# Patient Record
Sex: Female | Born: 1960 | Race: White | Hispanic: No | Marital: Married | Smoking: Never smoker
Health system: Southern US, Community
[De-identification: ages and names within clinical notes are randomized; demographics above are authoritative.]

## PROBLEM LIST (undated history)

## (undated) DIAGNOSIS — R011 Cardiac murmur, unspecified: Secondary | ICD-10-CM

## (undated) DIAGNOSIS — D219 Benign neoplasm of connective and other soft tissue, unspecified: Secondary | ICD-10-CM

## (undated) DIAGNOSIS — R87619 Unspecified abnormal cytological findings in specimens from cervix uteri: Secondary | ICD-10-CM

## (undated) DIAGNOSIS — IMO0002 Reserved for concepts with insufficient information to code with codable children: Secondary | ICD-10-CM

## (undated) DIAGNOSIS — R32 Unspecified urinary incontinence: Secondary | ICD-10-CM

## (undated) DIAGNOSIS — A64 Unspecified sexually transmitted disease: Secondary | ICD-10-CM

## (undated) DIAGNOSIS — C801 Malignant (primary) neoplasm, unspecified: Secondary | ICD-10-CM

## (undated) DIAGNOSIS — M797 Fibromyalgia: Secondary | ICD-10-CM

## (undated) HISTORY — DX: Fibromyalgia: M79.7

## (undated) HISTORY — DX: Unspecified urinary incontinence: R32

## (undated) HISTORY — DX: Benign neoplasm of connective and other soft tissue, unspecified: D21.9

## (undated) HISTORY — DX: Reserved for concepts with insufficient information to code with codable children: IMO0002

## (undated) HISTORY — DX: Unspecified sexually transmitted disease: A64

## (undated) HISTORY — PX: BLADDER SURGERY: SHX569

## (undated) HISTORY — PX: BREAST SURGERY: SHX581

## (undated) HISTORY — PX: BREAST EXCISIONAL BIOPSY: SUR124

## (undated) HISTORY — DX: Cardiac murmur, unspecified: R01.1

## (undated) HISTORY — DX: Malignant (primary) neoplasm, unspecified: C80.1

## (undated) HISTORY — DX: Unspecified abnormal cytological findings in specimens from cervix uteri: R87.619

## (undated) HISTORY — PX: ABDOMINAL HYSTERECTOMY: SHX81

---

## 2003-06-27 ENCOUNTER — Other Ambulatory Visit: Admission: RE | Admit: 2003-06-27 | Discharge: 2003-06-27 | Payer: Self-pay | Admitting: Internal Medicine

## 2003-06-29 ENCOUNTER — Ambulatory Visit (HOSPITAL_BASED_OUTPATIENT_CLINIC_OR_DEPARTMENT_OTHER): Admission: RE | Admit: 2003-06-29 | Discharge: 2003-06-29 | Payer: Self-pay | Admitting: Gynecology

## 2003-06-29 ENCOUNTER — Encounter (INDEPENDENT_AMBULATORY_CARE_PROVIDER_SITE_OTHER): Payer: Self-pay | Admitting: Specialist

## 2003-06-29 ENCOUNTER — Ambulatory Visit (HOSPITAL_COMMUNITY): Admission: RE | Admit: 2003-06-29 | Discharge: 2003-06-29 | Payer: Self-pay | Admitting: Gynecology

## 2003-07-27 ENCOUNTER — Encounter (INDEPENDENT_AMBULATORY_CARE_PROVIDER_SITE_OTHER): Payer: Self-pay

## 2003-07-27 ENCOUNTER — Inpatient Hospital Stay (HOSPITAL_COMMUNITY): Admission: RE | Admit: 2003-07-27 | Discharge: 2003-07-30 | Payer: Self-pay | Admitting: Gynecology

## 2003-08-09 ENCOUNTER — Ambulatory Visit (HOSPITAL_COMMUNITY): Admission: RE | Admit: 2003-08-09 | Discharge: 2003-08-09 | Payer: Self-pay | Admitting: Gynecology

## 2005-04-22 ENCOUNTER — Encounter: Admission: RE | Admit: 2005-04-22 | Discharge: 2005-07-21 | Payer: Self-pay | Admitting: *Deleted

## 2005-04-22 ENCOUNTER — Ambulatory Visit (HOSPITAL_COMMUNITY): Admission: RE | Admit: 2005-04-22 | Discharge: 2005-04-22 | Payer: Self-pay | Admitting: *Deleted

## 2005-08-19 ENCOUNTER — Encounter: Admission: RE | Admit: 2005-08-19 | Discharge: 2005-10-12 | Payer: Self-pay | Admitting: *Deleted

## 2005-09-02 ENCOUNTER — Observation Stay (HOSPITAL_COMMUNITY): Admission: RE | Admit: 2005-09-02 | Discharge: 2005-09-03 | Payer: Self-pay | Admitting: *Deleted

## 2005-09-02 ENCOUNTER — Encounter (INDEPENDENT_AMBULATORY_CARE_PROVIDER_SITE_OTHER): Payer: Self-pay | Admitting: Specialist

## 2005-10-13 HISTORY — PX: LAPAROSCOPIC GASTRIC BANDING: SHX1100

## 2005-10-28 ENCOUNTER — Encounter: Admission: RE | Admit: 2005-10-28 | Discharge: 2006-01-26 | Payer: Self-pay | Admitting: *Deleted

## 2006-02-03 ENCOUNTER — Encounter: Admission: RE | Admit: 2006-02-03 | Discharge: 2006-02-03 | Payer: Self-pay | Admitting: *Deleted

## 2006-10-13 HISTORY — PX: LAPAROSCOPIC CHOLECYSTECTOMY: SUR755

## 2006-12-21 ENCOUNTER — Ambulatory Visit (HOSPITAL_COMMUNITY): Admission: RE | Admit: 2006-12-21 | Discharge: 2006-12-21 | Payer: Self-pay | Admitting: Surgery

## 2006-12-28 ENCOUNTER — Inpatient Hospital Stay (HOSPITAL_COMMUNITY): Admission: EM | Admit: 2006-12-28 | Discharge: 2006-12-30 | Payer: Self-pay | Admitting: Emergency Medicine

## 2006-12-28 ENCOUNTER — Encounter (INDEPENDENT_AMBULATORY_CARE_PROVIDER_SITE_OTHER): Payer: Self-pay | Admitting: *Deleted

## 2006-12-28 ENCOUNTER — Encounter: Admission: RE | Admit: 2006-12-28 | Discharge: 2006-12-28 | Payer: Self-pay | Admitting: Internal Medicine

## 2008-01-27 DIAGNOSIS — N3941 Urge incontinence: Secondary | ICD-10-CM | POA: Insufficient documentation

## 2008-09-01 ENCOUNTER — Encounter: Admission: RE | Admit: 2008-09-01 | Discharge: 2008-09-01 | Payer: Self-pay | Admitting: Family Medicine

## 2009-06-05 ENCOUNTER — Emergency Department (HOSPITAL_BASED_OUTPATIENT_CLINIC_OR_DEPARTMENT_OTHER): Admission: EM | Admit: 2009-06-05 | Discharge: 2009-06-05 | Payer: Self-pay | Admitting: Emergency Medicine

## 2009-09-28 ENCOUNTER — Encounter: Admission: RE | Admit: 2009-09-28 | Discharge: 2009-09-28 | Payer: Self-pay | Admitting: Family Medicine

## 2010-10-13 HISTORY — PX: COLPOSCOPY: SHX161

## 2010-10-13 HISTORY — PX: OTHER SURGICAL HISTORY: SHX169

## 2011-02-28 NOTE — Op Note (Signed)
Amy Ritter, Amy Ritter               ACCOUNT NO.:  [id number]   MEDICAL RECORD NO.:  [id number]          PATIENT TYPE:  INP   LOCATION:  0098                         FACILITY:  [facility]   PHYSICIAN:  Ardeth Sportsman, MD     DATE OF BIRTH:  [birth year]   DATE OF PROCEDURE:  DATE OF DISCHARGE:                               OPERATIVE REPORT   PRIMARY CARE PHYSICIAN:  Eileen Stanford, P.A., urgent care center.   SURGEON:  Ardeth Sportsman, M.D.   ASSISTANT:  Sandria Bales. Ezzard Standing, M.D.   PREOPERATIVE DIAGNOSIS:  Acute cholecystitis.   POSTOPERATIVE DIAGNOSIS:  Acute cholecystitis with probable gangrene.   PROCEDURE PERFORMED:  Laparoscopic lysis of adhesions x60 minutes and  laparoscopic cholecystectomy with intraoperative cholangiogram.   ANESTHESIA:  1. General anesthesia.  2. Local anesthetic in a field block around all port sites.   ASSESSMENT:  Gallbladder drains, a 15 Jamaica Blake drain through a right  upper quadrant incision with the curving in the subhepatic space with  the tip in Morrison's pouch.   ESTIMATED BLOOD LOSS:  300 ml.   COMPLICATIONS:  None apparent.   INDICATIONS:  Amy Ritter is a 50 year old female, status post  adjustable gastric banding laparoscopically by Dr. Colin Benton back in  November 2006, who has had about a 10-day history of worsening abdominal  pain with exam and ultrasound today concerning for acute cholecystitis.   The anatomy and physiology of hepatobiliary and pancreatic function was  discussed.  The pathophysiology of cholecystolithiasis and acute  cholecystitis with other risks were discussed.  Options were discussed,  and recommendation was made for laparoscopic cholecystectomy with  intraoperative cholangiogram.   The risks such as stroke, heart attack, deep venous thrombosis,  pulmonary embolus, and death are discussed.  Risks such as bleeding,  need for transfusion, wound infection, abscess, injury to other organs,  bile duct leak or injury  resulting in the need for internal/external  drainage and/or operative reconstruction was discussed.  Other risks  were discussed.  Questions answered.  She agreed to proceed.   OPERATIVE FINDINGS:  She had a very thickened gallbladder with the  greater omentum concreted onto the gallbladder.  There were some  possible pockets of gangrene but no definite perforation.  She had  empyema of the gallbladder.  The cholangiogram appeared to be normal.   DESCRIPTION OF PROCEDURE:  Informed consent was confirmed.  She was  already on IV Unasyn.  She urinated prior to going to the operating room  table.  She had sequential compression devices active during the entire  case.  She underwent general anesthesia without difficulty.  She was  positioned supine with both arms tucked.  Her abdomen was prepped and  draped in a sterile fashion.   Entry was gained into the abdomen through an infraumbilical vertical  incision.  The towel clamp was used to hold the umbilical stalk for  fascial counter-traction.  The Varies needle was passed easily into the  abdomen.  The needle aspirated and flushed easily.  Capnopreperitoneum  to 15 mmHg provided good abdominal insufflation.  A 5 mm port  was  placed.  Camera inspection revealed no intra-abdominal injury.  Findings  as noted above.   Under direct visualization, 5 mm ports were placed in the right  subcostal and right flank region, and a 10 mm port was placed through  the falciform ligament on the subxiphoid region.   Careful dissection was made to help free the greater omentum, transverse  colon as well as duodenum off the gallbladder.  Initially, gallbladder  could not be grasped, so it was aspirated with white bile and some  purulence in it.  It allowed the gallbladder to be better grasped.  Also, we were able to free off  the attachments on the peritoneal  surface of the gallbladder.  This is where the majority of the bleeding  was lost since there  was a lot of dense omental adhesions, and they were  very raw and frayed and tried to minimize the use of cautery initially  to avoid any accidental injury.  Ultimately, the infundibulum could be  grasped, and the peritoneum could be freed off the anteriomedial, and  posteriolateral aspects of the gallbladder as it touched near the liver.  Circumferential dissection was done, and ultimately, it was able to  identify pulsatile structure going into the anteromedial aspect of the  gallbladder, consistent with the anterior branch of the cystic artery.  One clip on the gallbladder side and two clips slightly proximal, this  was transected.  Further dissection revealed a posterior branch of the  cystic artery that unfortunately shred a little bit and there was also a  little bit of blood with this.  I was able to get a couple of clips on  to get excellent hemostasis.  Care and dissection was made to make sure  that this was indeed the posterior branch of the cystic artery and was.  One clip on the gallbladder side was made, and the posterior branch of  the cystic artery was transected.  This left one remaining structure  going from the infundibulum of the gallbladder down into the portal  hepatis, consistent with the cystic duct .  Two clips were made on the  infundibulum, and a partial cystic ductotomy was performed.   A 5 French cholangiogram catheter was placed through a subxiphoid stab  incision, flushed, and passed into the partial ductotomy close to the  infundibulum.  I was able to clamp around the catheter with a clip, and  it flushed well.  A cholangiogram was run using continuous fluoroscopy  with good flow into a side branch, consistent with cystic duct  cannulization.  Contrast flowed easily up into the common hepatic duct  and right and left intrahepatic chains.  No evidence of leak of bile.  Contrast flowed well into the distal common bile duct into the duodenum as well.  This  consisted with intraoperative cholangiogram.  The  cholangiocatheter was removed.   Four clips were made on the cystic duct just slightly proximal to the  partial cystectotomy, and transection was completed.  The gallbladder  was freed from its remaining attachments on the liver bed using careful  control of dissection.  Aggressive cautery was used for good hemostasis  on the liver bed.   Careful inspection of the cystic and arterial, anterior and posterior  branch stumps as well as cystic duct stump were done, and they looked  nice and clear with no leak of bile or blood.  Copious irrigation of  probably about 8 liters of saline was done  total to help break up old  clot on the omentum and make sure there was no evidence of any active  bleeding.  At the end, there was nice, clear return.  There was some  aeration down the pelvis and left upper quadrant as well, and this was  carefully aspirated.  A 15 [place] Blake drain was passed into the  abdomen under direct visualization and brought out the right subcostal  port site.  The tip was directed in the Morrison's pouch posterior, and  the middle part was brought up underneath the liver bed to help catch  any excess fluid and help decrease the risk of abscess formation and  keep an eye and make sure there was no bleeders.  The omentum was pushed  up there also to help with hemostasis.  I did not feel Surgicel was  needed, as there was no active bleeding at this time.  The drain site  was secured using 2-0 nylon stitch.   The gallbladder was placed in an EndoCatch bag.  Attempt was made to  remove it out the subxiphoid port, but the bag ripped, so the incision  had to be opened up, and the gallbladder removed out.  The fascial  defect was large enough that I used a laparoscopic suture passer.  I  used 0 Vicryl x4 interrupted stitches to help reapproximate the fascia.  The upper three abdominal ports were removed, and careful inspection   revealed no evidence of any bleeding.  Inspection of the liver again  revealed no evidence of bleeding.  The capnopreperitoneum was evacuated,  and umbilical port was removed.  Fascial stitches were tied down the  subxiphoid.  About 500 ml of saline was given into the wound to nice,  clear return.  The wound was partially approximated using a 4-0 Monocryl  stitch, and the gauze was packed into the wound with a wick to help  prevent deep infection.  The rest of the incisions were closed using 4-0  Monocryl.  A sterile dressing was placed around all of the drains and  open areas and incisions.  The patient was extubated and taken to the  recovery room in stable condition.   I explained the operative findings to the patient's family, and  postoperative instructions were explained.  Questions were answered, and  they expressed understanding and appreciation.      Ardeth Sportsman, MD  Electronically Signed    SCG/MEDQ  D:  12/28/2006  T:  12/29/2006  Job:  [id number]   cc:   Alfonse Ras, MD  [address]  [place]  [place] [address]   Eileen Stanford, P.A.

## 2011-02-28 NOTE — Discharge Summary (Signed)
Amy Ritter, Amy Ritter               ACCOUNT NO.:  [id number]   MEDICAL RECORD NO.:  [id number]          PATIENT TYPE:  INP   LOCATION:  1317                         FACILITY:  [facility]   PHYSICIAN:  Ardeth Sportsman, MD     DATE OF BIRTH:  Dec 05, 1960   DATE OF ADMISSION:  12/28/2006  DATE OF DISCHARGE:                               DISCHARGE SUMMARY   DISCHARGE SUMMARY   PRIMARY CARE PHYSICIAN:  Dr. Eileen Stanford, [facility]   OTHER SURGEONS:  Bariatric Surgeon is Dr. Baruch Merl, Surgeon Dr.  Karie Soda.   DIAGNOSES:  Acute cholecystitis with empyema of the gallbladder.  Morbid obesity status post adjustable gastric banding.  Dysfunctional uterine bleeding status post hysterectomy.  Stress urinary incontinence status post Birch bladder tacking.  Benign right breast biopsy in the past.   PROCEDURE PERFORMED:  Laparoscopic cholecystectomy with intraoperative  cholangiogram on December 28, 2006.   SUMMARY OF HOSPITAL COURSE:  Amy Ritter is a 50 year old female who  underwent adjustable gastric banding back in November 2006 for morbid  obesity and has been followed by Dr. Colin Benton and had been making good  improvements.  Unfortunately she developed abdominal pain and had  evidence of acute cholecystitis.  She was admitted, placed on IV  antibiotics, and underwent emergent cholecystectomy.  She was placed on  IV antibiotics and hydrated with IV medications and IV fluid.   By the time of discharge she was tolerating oral intake well and had  good pain control on all pain medications.  She was walking the hallways  well.  Her leukocytosis resolved on IV cefoxitin and she was switched  over to oral Augmentin.  She has numerous allergies but seemed to have  good pain control with Percocet and Tylenol.  SHE HAS POOR TOLERANCE TO  NONSTEROIDALS.   Since she had improved, and it was felt reasonable for her to be  discharged home with the following instructions:   1. She is to return  to see me in about 7-10 days.  She has a drain and      needs to have daily outputs recorded for this.  We most likely will      remove the drain at that time if the output remains low.  Drain did      not look bilious.  2. She should take Augmentin 875 mg p.o. b.i.d. for the next 10 days.      It is a rather large pill with her band, but given her allergies to      other medications, I thought this was the most reasonable      medication for her and she probably can crush it if okay with the      pharmacy.  3. Percocet and Tylenol for pain control (Percocet 5/325 1-2 p.o. q.4      hours p.r.n. pain #50).  She can also do heating pads or ice packs      as needed or warm showers as needed for pain.  4. She should follow up with Dr. Colin Benton probably in the next 2 or  3      weeks after recovery from the cholecystitis for monthly followup on      her adjustable gastric banding.  Dr. Colin Benton is out of town, but I      did at least mention the case to one of the bariatric surgeons, who      thought it was a reasonable plan as well.  5. She should call if she has any fevers, chills, sweats, nausea,      vomiting, terrible diarrhea, constipation or other      concerns.  6. She is not on any other home medications.  She does not need any      additional home medications at this time, just the Percocet and      Augmentin as already mentioned above.      Ardeth Sportsman, MD  Electronically Signed     SCG/MEDQ  D:  12/30/2006  T:  12/30/2006  Job:  [id number]

## 2011-02-28 NOTE — H&P (Signed)
[name], HENDRICKSEN NO.:  [id number]   MEDICAL RECORD NO.:  [id number]          PATIENT TYPE:  INP   LOCATION:  1610                         [facility]ACILITY:  [facility]   PHYSICIAN:  Ardeth Sportsman, MD     DATE OF BIRTH:  [birth year]   DATE OF ADMISSION:  12/28/2006  DATE OF DISCHARGE:                              HISTORY & PHYSICAL   REQUESTING PHYSICIAN:  Eileen Stanford, P.A., at urgent care center, and  emergency physician, Gavin Pound. Oletta Lamas, M.D.   REASON FOR VISIT:  Probable acute cholecystitis.   HISTORY OF PRESENT ILLNESS:  Ms. Amy Ritter is a 50 year old female, obese,  who underwent laparoscopic adjustable gastric banding by Dr. Baruch Merl back in November, 2006.  She has had consistent weight loss and  has done well; however, about 10 days ago, she noted that she started  getting intermittent episodes of abdominal pain.  It tended to be in the  right upper quadrant after eating.  It occasionally radiated to her back  and shoulder.  She had nausea and threw up.  She went to the urgent care  center, and Eileen Stanford, physician's assistant, saw her last week and  recommended an ultrasound for evaluation of possible gallbladder  etiology.  She had that done today.  Ultrasound was concerning for acute  cholecystitis.  The patient notes that she is having worsening pain, and  now instead of being intermittent, it has been rather constant pain.  Basically because of concerns over this condition, she was able to come  to the emergency room for evaluation and surgical consultation.  The  patient denies any sick contacts or any travel history.  She normally  has a bowel movement about every 2-3 days after the band, given her  decreased appetite.  No history of any ulcer disease, heartburn, or  reflux.  She did have a vagotomy with her adjustable gastric banding.  No real fevers, chills, or sweats.  She never really had anything like  this before.  She did not take any  Actigall or bile absorption salts  after the gastric banding.   PAST MEDICAL HISTORY:  1. Morbid obesity, status post adjustable gastric banding.  2. Dysfunctional uterine bleeding in the past.  3. Stress urinary incontinence.   She does have a history of fibromyalgia in the distant past as well as  insomnia.  That seems to have resolved.   PAST SURGICAL HISTORY:  She has had C-section for twins.  She did have a  hysterectomy as well as a retropubic urethropexy for stress urinary  incontinence for her birth procedure, emulsion with culdoplasty by Dr.  Lily Peer on July 27, 2003.  She did have laparoscopic adjustable  gastric banding with Drs. Lorenza Evangelist on September 02, 2005.  Next,  she has had a right breast biopsy in the past that was benign.   MEDICATIONS:  None currently.   ALLERGIES:  She has problems with ASPIRIN and IBUPROFEN as well as  MORPHINE, CELEXA , and SULFA.  She apparently tolerates DILAUDID well.   FAMILY HISTORY:  Noncontributory for any  major GI disorders or any major  cardiac or pulmonary events.   SOCIAL HISTORY:  No tobacco, alcohol, or illicit drug use.   REVIEW OF SYSTEMS:  As noted in HPI, otherwise constitutional,  ophthalmologic, ENT, cardiac, respiratory, hepatic, renal, and endocrine  are otherwise negative.  Musculoskeletal, neurological, psychiatric,  dermatologic, heme, lymph are otherwise negative.  GI is noted as per  HPI, otherwise no hematochezia or melena, otherwise completely negative.  GYN:  As noted per HPI, otherwise negative.   PHYSICAL EXAMINATION:  VITAL SIGNS:  Her temperature is 98.2, pulse 55-  70, 6/10 pain, respirations 18-20, 97% on room air, blood pressure  116/76.  GENERAL:  She is a well-developed, well-nourished, obese female, not  toxic, but not completely comfortable.  HEENT:  She is normocephalic with no facial asymmetry.  Mucous membranes  are dry.  Nasopharynx and oropharynx is clear.  Pupils are equal,  round  and reactive to light.  Extraocular movements are intact.  Sclerae are  anicteric or injected.  NECK:  Supple without any masses.  Trachea is midline.  CHEST:  Clear to auscultation bilaterally with wheezes, rales or  rhonchi.  No pain on rib or sternal compression.  HEART:  Regular rate and rhythm.  No murmurs, clicks, or rubs.  ABDOMEN:  Her incisions are all well healed.  She has a subcutaneous  mass in her right mid abdomen, consistent with her poor access for her  adjustable gastric band.  There is no fluctulence or cellulitis.  There  are no incisional hernias.  In the right upper quadrant, she does have  tenderness to palpation with a little bit of a mass effect and a  positive Murphy's sign.  She does not have peritonitis elsewhere.  She  has no umbilical hernia.  GENITOURINARY:  Normal external female genitalia.  RECTAL:  Deferred per patient's request.  EXTREMITIES:  No significant clubbing, cyanosis or edema.  MUSCULOSKELETAL:  Full range of motion of her shoulders as well as  wrists, hips, knees, ankles.  LYMPH:  No head/neck axillary or groin lymphadenopathy.  PSYCHIATRIC:  Pleasant, interactive.  No evidence of dementia, delirium,  psychosis, or paranoia.   LABORATORY VALUES:  Her white count is 5.5 with a hemoglobin of 13.6.  Her electrolytes are normal.  Liver function tests and lipase are in the  normal range, INR of 1.  Urinalysis is negative.   Ultrasound shows some gallbladder wall thickening around 5.5 mm (normal  less than 3 mm).  She has possible area of pericholecystic fluid.  She  has gallstones but no definite wedge stones.  She has a sonographic  Murphy's sign.  She has no ductal dilatation.  The common bile duct is  around 4 mm size.  This is all consistent with acute cholecystitis.   ASSESSMENT/PLAN:  A 50 year old female with rapid weight loss over the past year and a half, secondary to adjustable gastric banding with  symptomatic  cholecystolithiasis and examination and sonographic findings  concerning for cholecystitis.  Anatomy and embryology of hepatobiliary  function and pancreatic function was explained.  Pathophysiology of  cholecystolithiasis with its risk of acute cholecystotic gallstone  pancreatitis, dehydration, choledocholithiasis, and other risks were  discussed.  Operative risks were discussed.  The recommendation was made  for laparoscopic cholecystectomy with intraoperative cholangiogram.   The risks such as stroke, heart attack, deep venous thrombosis,  pulmonary embolus, or death are discussed.  Risks such as bleeding, need  for transfusion, wound infection, abscess, injury to  other organs, bile  duct injury resulting in the need for internal/external drainage or  operative reconstruction were discussed.  The risk of incisional hernia,  prolonged pain, and other risks were discussed.  The possibility of a  long hospitalization needing IV antibiotics versus just an overnight  stay, depending on the pathology and natural recovery was discussed.  The risk of the band becoming infected was discussed as well.  I did  discuss the case briefly with Drs. [employer], given the  history of lap banding, as Dr. Colin Benton was out of town.  We all feel it is  reasonable to go ahead with cholecystectomy  and do our best to avoid violating any areas of the band.  Based on  this, I do not think I would desufflate her band at this point.   Questions were answered.  Patient and her husband agree to proceed.      Ardeth Sportsman, MD  Electronically Signed     SCG/MEDQ  D:  12/28/2006  T:  12/28/2006  Job:  [id number]   cc:   Eileen Stanford, PA  [phone]

## 2012-07-05 ENCOUNTER — Telehealth (INDEPENDENT_AMBULATORY_CARE_PROVIDER_SITE_OTHER): Payer: Self-pay | Admitting: Surgery

## 2012-07-05 NOTE — Telephone Encounter (Signed)
I called the patient per Dr Ermalene Searing request concerning lap band surgery follow-up. The home/work # listed [phone]) had an automated message stating the mailbox had not been initialized and cannot accept messages/Please call back at a later time. I then called and left a voicemail at cell # listed asking the patient to return my phone call at [phone]. cef

## 2013-01-11 ENCOUNTER — Telehealth: Payer: Self-pay | Admitting: Certified Nurse Midwife

## 2013-01-11 NOTE — Telephone Encounter (Signed)
Pt. States she is to come in for blood work per [employer]

## 2013-01-11 NOTE — Telephone Encounter (Signed)
Left message for patient to call back to office. Amy Ritter

## 2013-01-12 ENCOUNTER — Encounter: Payer: Self-pay | Admitting: Certified Nurse Midwife

## 2013-01-12 ENCOUNTER — Telehealth: Payer: Self-pay | Admitting: *Deleted

## 2013-01-12 DIAGNOSIS — Z0000 Encounter for general adult medical examination without abnormal findings: Secondary | ICD-10-CM

## 2013-01-12 NOTE — Telephone Encounter (Signed)
SEE LAB ORDERS BELOW PER D. LEONARD. CNM, PATIENT NOTIFIED OF LAB APPT. FOR FASTING LAB WORK TO BE DONE ON 04/08/ @ 80;45AM.. WILL SEE DR. SILVA ON 04/09/ 10AM.

## 2013-01-12 NOTE — Telephone Encounter (Signed)
Left message on cell # to call our office . Called work # in [employer] system does not accept incoming calls. sue

## 2013-01-12 NOTE — Telephone Encounter (Signed)
Patient will need fasting CMP,Lipid Panel, CBC, TSH diagnosis V72.62 Also she needs appointment scheduled with Dr. Edward Jolly regarding incontinence evaluation

## 2013-01-12 NOTE — Telephone Encounter (Signed)
Patient states had her AEX done in November. Was told that she could come here for her lab work to be done. States she will need lab workdone before April due to insurance. Also she stated she was to have gotten appointment to see Dr.  Edward Jolly that you had suggested. Please advise. Will put chart on your desk. sue

## 2013-01-18 ENCOUNTER — Other Ambulatory Visit: Payer: Self-pay | Admitting: Certified Nurse Midwife

## 2013-01-18 ENCOUNTER — Other Ambulatory Visit (INDEPENDENT_AMBULATORY_CARE_PROVIDER_SITE_OTHER): Payer: 59

## 2013-01-18 DIAGNOSIS — E559 Vitamin D deficiency, unspecified: Secondary | ICD-10-CM

## 2013-01-18 DIAGNOSIS — Z0000 Encounter for general adult medical examination without abnormal findings: Secondary | ICD-10-CM

## 2013-01-18 LAB — CBC WITH DIFFERENTIAL/PLATELET
Basophils Absolute: 0 10*3/uL (ref 0.0–0.1)
Basophils Relative: 0 % (ref 0–1)
Eosinophils Absolute: 0.1 10*3/uL (ref 0.0–0.7)
Eosinophils Relative: 2 % (ref 0–5)
HCT: 40.4 % (ref 36.0–46.0)
Hemoglobin: 13.2 g/dL (ref 12.0–15.0)
Lymphocytes Relative: 37 % (ref 12–46)
Lymphs Abs: 1.8 10*3/uL (ref 0.7–4.0)
MCH: 29.8 pg (ref 26.0–34.0)
MCHC: 32.7 g/dL (ref 30.0–36.0)
MCV: 91.2 fL (ref 78.0–100.0)
Monocytes Absolute: 0.4 10*3/uL (ref 0.1–1.0)
Monocytes Relative: 8 % (ref 3–12)
Neutro Abs: 2.6 10*3/uL (ref 1.7–7.7)
Neutrophils Relative %: 53 % (ref 43–77)
Platelets: 268 10*3/uL (ref 150–400)
RBC: 4.43 MIL/uL (ref 3.87–5.11)
RDW: 12.9 % (ref 11.5–15.5)
WBC: 4.9 10*3/uL (ref 4.0–10.5)

## 2013-01-18 LAB — COMPLETE METABOLIC PANEL WITH GFR
ALT: 11 U/L (ref 0–35)
AST: 11 U/L (ref 0–37)
Albumin: 4 g/dL (ref 3.5–5.2)
Alkaline Phosphatase: 58 U/L (ref 39–117)
BUN: 12 mg/dL (ref 6–23)
CO2: 28 mEq/L (ref 19–32)
Calcium: 9 mg/dL (ref 8.4–10.5)
Chloride: 104 mEq/L (ref 96–112)
Creat: 0.86 mg/dL (ref 0.50–1.10)
GFR, Est African American: >89 mL/min
GFR, Est Non African American: 78 mL/min
Glucose, Bld: 96 mg/dL (ref 70–99)
Potassium: 4.1 mEq/L (ref 3.5–5.3)
Sodium: 139 mEq/L (ref 135–145)
Total Bilirubin: 0.6 mg/dL (ref 0.3–1.2)
Total Protein: 6.4 g/dL (ref 6.0–8.3)

## 2013-01-18 LAB — LIPID PANEL
Cholesterol: 180 mg/dL (ref 0–200)
HDL: 47 mg/dL (ref >39)
LDL Cholesterol: 116 mg/dL — ABNORMAL HIGH (ref 0–99)
Total CHOL/HDL Ratio: 3.8 Ratio
Triglycerides: 86 mg/dL (ref <150)
VLDL: 17 mg/dL (ref 0–40)

## 2013-01-18 LAB — TSH: TSH: 1.457 u[IU]/mL (ref 0.350–4.500)

## 2013-01-19 ENCOUNTER — Encounter: Payer: Self-pay | Admitting: Obstetrics and Gynecology

## 2013-01-19 ENCOUNTER — Ambulatory Visit (INDEPENDENT_AMBULATORY_CARE_PROVIDER_SITE_OTHER): Payer: 59 | Admitting: Obstetrics and Gynecology

## 2013-01-19 VITALS — BP 124/80 | Ht 66.0 in | Wt 254.0 lb

## 2013-01-19 DIAGNOSIS — N3946 Mixed incontinence: Secondary | ICD-10-CM

## 2013-01-19 DIAGNOSIS — E669 Obesity, unspecified: Secondary | ICD-10-CM

## 2013-01-19 LAB — VITAMIN D 25 HYDROXY (VIT D DEFICIENCY, FRACTURES): Vit D, 25-Hydroxy: 29 ng/mL — ABNORMAL LOW (ref 30–89)

## 2013-01-19 MED ORDER — OXYBUTYNIN CHLORIDE ER 10 MG PO TB24: 10.0000 mg | ORAL_TABLET | Freq: Every day | ORAL | Status: DC

## 2013-01-19 NOTE — Progress Notes (Signed)
Patient ID: Amy Ritter, female   DOB: December 28, 1960, 52 y.o.   MRN: [id number]  52 year old G3P4, Status post TAH (still has ovaries), status post pubovaginal sling with insitu vaginal wall sling Dr. Carlos American after twins delivered and status post Burch in 2006 at time of hysterectomy who presents with urinary incontinence.  Leakage can be a gush and has spontaneous leakage at night when sleeping.  Leaks with cough, laugh, sneeze.  Sometimes leak without any provocation.   Leaks more when bladder is full.  Wears Poise for protection.  Patient was dry after each surgery for some period of time.  Used Detrol for some period of time but it gave her migraines.    No bladder evaluation since last surgery.    Daytime frequency - 1 -2 hours Night time frequency - 1 -2 times per night.  Having "chronic cystitis".  In office has 1 UTI documented per year.  States history of pyelonephritis prior to hysterectomy.  Thinks she may have a history of possible nephrolithiasis.  Diagnosis unclear.  No treatment.    Also notes right sided abdominal pain.    Lactose intolerant.  Diarrhea if consumes milk.   States some constipation and diarrhea due to IBS.    History of fibromyalgia.  Habits 1 coffee and 1 caffeine soda per day. 2 16 ounce bottles of water per day Rare ETOH use.  Social history - From [place].  ROS - Please refer to HPI.  Physical exam  Abdomen - 2 Pfannensteil incisions. Palpable lap band in upper abdomen.  Obese, soft, nontender, nondistended.  No hepatosplenomegaly or organomegaly.    Pelvic exam - Normal external genitalia and urethra.  Urethra appears to be open with patient in lithotomy position on exam table.  Urethral deflection with sterile Q-tip and 2 % xylocaine jelly - 30 degrees.  Good bladder neck support.  No cystocele, rectocele of vaginal vault prolapse.  Cervix absent.  No midline nor adnexal masses or tenderness.     Assessment  Mixed Incontinence. Intolerance to  Detrol. Status post probable pubovaginal sling and status post Burch. Obesity.  Plan  I will refer patient to [facility] Got[facility] for pelvic floor rehabilitation.   I discussed bladder irritants with patient.   Start Ditropan XL 10 mg po q day.  See EPIC orders.  I have discussed potential side effects. Follow up in 6 weeks for a recheck. I discussed weight loss through diet and exercise. Patient will also follow up with [facility] regarding her lap band procedure. If patient needs procedural intervention for her incontinence, I recommend she do her evaluation with Dr. Sherron Monday at [facility].  I believe she would need video urodynamics.  She potentially could be a candidate for urethral bulking agents.

## 2013-01-19 NOTE — Patient Instructions (Addendum)
Urinary Incontinence Your doctor wants you to have this information about urinary incontinence. This is the inability to keep urine in your body until you decide to release it. CAUSES  Prostate gland enlargement is a common cause of urinary incontinence. But there are many different causes for losing urinary control. They include:  Medicines.  Infections.  Prostate problems.  Surgery.  Neurological diseases.  Emotional factors. DIAGNOSIS  Evaluating the cause of incontinence is important in choosing the best treatment. This may require:  An ultrasound exam.  Kidney and bladder X-rays.  Cystoscopy. This is an exam of the bladder using a narrow scope. TREATMENT  For incontinent patients, normal daily hygiene and using changing pads or adult diapers regularly will prevent offensive odors and skin damage from the moisture. Changing your medicines may help control incontinence. Your caregiver may prescribe some medicines to help you regain control. Avoid caffeine. It can over-stimulate the bladder. Use the bathroom regularly. Try about every 2 to 3 hours even if you do not feel the need. Take time to empty your bladder completely. After urinating, wait a minute. Then try to urinate again. External devices used to catch urine or an indwelling urine catheter (Foley catheter) may be needed as well. Some prostate gland problems require surgery to correct. Call your caregiver for more information. Document Released: 11/06/2004 Document Revised: 12/22/2011 Document Reviewed: 11/01/2008 [employer] Patient Information 2013 [employer].  

## 2013-01-20 ENCOUNTER — Telehealth: Payer: Self-pay

## 2013-01-20 NOTE — Telephone Encounter (Signed)
error 

## 2013-03-02 ENCOUNTER — Ambulatory Visit: Payer: 59 | Admitting: Obstetrics and Gynecology

## 2013-04-22 ENCOUNTER — Ambulatory Visit: Payer: Self-pay | Admitting: Obstetrics and Gynecology

## 2013-05-04 ENCOUNTER — Ambulatory Visit (INDEPENDENT_AMBULATORY_CARE_PROVIDER_SITE_OTHER): Payer: PRIVATE HEALTH INSURANCE | Admitting: Obstetrics and Gynecology

## 2013-05-04 ENCOUNTER — Encounter: Payer: Self-pay | Admitting: Obstetrics and Gynecology

## 2013-05-04 VITALS — BP 130/80 | HR 72 | Ht 66.0 in | Wt 256.0 lb

## 2013-05-04 DIAGNOSIS — Z23 Encounter for immunization: Secondary | ICD-10-CM

## 2013-05-04 DIAGNOSIS — E669 Obesity, unspecified: Secondary | ICD-10-CM

## 2013-05-04 DIAGNOSIS — E559 Vitamin D deficiency, unspecified: Secondary | ICD-10-CM

## 2013-05-04 DIAGNOSIS — Z1239 Encounter for other screening for malignant neoplasm of breast: Secondary | ICD-10-CM

## 2013-05-04 DIAGNOSIS — N3946 Mixed incontinence: Secondary | ICD-10-CM

## 2013-05-04 MED ORDER — SOLIFENACIN SUCCINATE 5 MG PO TABS: 5.0000 mg | ORAL_TABLET | Freq: Every day | ORAL | Status: DC

## 2013-05-04 NOTE — Progress Notes (Signed)
Patient ID: Amy Ritter, female   DOB: 10-29-1960, 52 y.o.   MRN: [id number]  Subjective  Patient is here for follow up of urinary incontinence. Status post TAH. Status post insitu midurethral sling? Status post Burch procedure for urinary incontinence.  Referred to [facility] for pelvic floor therapy and videourodynamics.  Did not have an appointment because they would not precert her visit and patient did not want to pay out of pocket.   Patient contacted her insurance company, and they did say they would pay if [facility] did her precert.  Started Detrol for overactive bladder, but stopped due to jaw, neck, and arm discomfort.  Took for less than one month.  Did help bladder however.   Patient did not follow up with [facility] regarding her lap band procedure, which was done in 2007. History of cholecystectomy in 2008.  Patient has a low vitamin D level.  Had difficulty remembering.  Would prefer to take once a month. Last level was 29 in April 2014.    Due for mammogram.  Due for tetanus.    Objective  No exam  Assessment  Mixed incontinence. Status post two anti-incontinence procedures. Reaction to Ditropan. Obesity. Vitamin D deficiency. Need for mammogram. Need for TDap vaccine  Plan  Will try Vesicare 5 mg daily.  See Epic orders. Referral to [facility] for pelvic floor rehabilitation. Referral to Dr. Luretha Murphy for follow up of lap band. Patient understands that weight loss will help to improve her stress incontinence.  No urodynamic evaluation at this time.  Check Vitamin D level today. Order placed for mammogram.  Patient understands she needs to call to schedule this.  T Dap today. Follow up in three months.

## 2013-05-04 NOTE — Patient Instructions (Signed)
Urinary Incontinence Your doctor wants you to have this information about urinary incontinence. This is the inability to keep urine in your body until you decide to release it. CAUSES  Prostate gland enlargement is a common cause of urinary incontinence. But there are many different causes for losing urinary control. They include:  Medicines.  Infections.  Prostate problems.  Surgery.  Neurological diseases.  Emotional factors. DIAGNOSIS  Evaluating the cause of incontinence is important in choosing the best treatment. This may require:  An ultrasound exam.  Kidney and bladder X-rays.  Cystoscopy. This is an exam of the bladder using a narrow scope. TREATMENT  For incontinent patients, normal daily hygiene and using changing pads or adult diapers regularly will prevent offensive odors and skin damage from the moisture. Changing your medicines may help control incontinence. Your caregiver may prescribe some medicines to help you regain control. Avoid caffeine. It can over-stimulate the bladder. Use the bathroom regularly. Try about every 2 to 3 hours even if you do not feel the need. Take time to empty your bladder completely. After urinating, wait a minute. Then try to urinate again. External devices used to catch urine or an indwelling urine catheter (Foley catheter) may be needed as well. Some prostate gland problems require surgery to correct. Call your caregiver for more information. Document Released: 11/06/2004 Document Revised: 12/22/2011 Document Reviewed: 11/01/2008 [facility] Patient Information 2014 [place], [place].  Vitamin D Deficiency Vitamin D is an important vitamin that your body needs. Having too little of it in your body is called a deficiency. A very bad deficiency can make your bones soft and can cause a condition called rickets.  Vitamin D is important to your body for different reasons, such as:   It helps your body absorb 2 minerals called calcium and  phosphorus.  It helps make your bones healthy.  It may prevent some diseases, such as diabetes and multiple sclerosis.  It helps your muscles and heart. You can get vitamin D in several ways. It is a natural part of some foods. The vitamin is also added to some dairy products and cereals. Some people take vitamin D supplements. Also, your body makes vitamin D when you are in the sun. It changes the sun's rays into a form of the vitamin that your body can use. CAUSES   Not eating enough foods that contain vitamin D.  Not getting enough sunlight.  Having certain digestive system diseases that make it hard to absorb vitamin D. These diseases include Crohn's disease, chronic pancreatitis, and cystic fibrosis.  Having a surgery in which part of the stomach or small intestine is removed.  Being obese. Fat cells pull vitamin D out of your blood. That means that obese people may not have enough vitamin D left in their blood and in other body tissues.  Having chronic kidney or liver disease. RISK FACTORS Risk factors are things that make you more likely to develop a vitamin D deficiency. They include:  Being older.  Not being able to get outside very much.  Living in a nursing home.  Having had broken bones.  Having weak or thin bones (osteoporosis).  Having a disease or condition that changes how your body absorbs vitamin D.  Having dark skin.  Some medicines such as seizure medicines or steroids.  Being overweight or obese. SYMPTOMS Mild cases of vitamin D deficiency may not have any symptoms. If you have a very bad case, symptoms may include:  Bone pain.  Muscle pain.  Falling often.  Broken bones caused by a minor injury, due to osteoporosis. DIAGNOSIS A blood test is the best way to tell if you have a vitamin D deficiency. TREATMENT Vitamin D deficiency can be treated in different ways. Treatment for vitamin D deficiency depends on what is causing it. Options  include:  Taking vitamin D supplements.  Taking a calcium supplement. Your caregiver will suggest what dose is best for you. HOME CARE INSTRUCTIONS  Take any supplements that your caregiver prescribes. Follow the directions carefully. Take only the suggested amount.  Have your blood tested 2 months after you start taking supplements.  Eat foods that contain vitamin D. Healthy choices include:  Fortified dairy products, cereals, or juices. Fortified means vitamin D has been added to the food. Check the label on the package to be sure.  Fatty fish like salmon or trout.  Eggs.  Oysters.  Spend some time in the sun. Most people should get out in the sun without sunblock for 10 to 15 minutes at a time. Do this 3 times a week. People who have had skin cancer should not do this. Ask your caregiver how long you should be in the sun. Do not use a tanning bed.  Keep your weight at a healthy level. Lose weight if you need to.  Keep all follow-up appointments. Your caregiver will need to perform blood tests to make sure your vitamin D deficiency is going away. SEEK MEDICAL CARE IF:  You have any questions about your treatment.  You continue to have symptoms of vitamin D deficiency.  You have nausea or vomiting.  You are constipated.  You feel confused.  You have severe abdominal or back pain. MAKE SURE YOU:  Understand these instructions.  Will watch your condition.  Will get help right away if you are not doing well or get worse. Document Released: 12/22/2011 Document Reviewed: 12/22/2011 [facility] Patient Information 2014 [place], [place].

## 2013-05-05 LAB — VITAMIN D 25 HYDROXY (VIT D DEFICIENCY, FRACTURES): Vit D, 25-Hydroxy: 39 ng/mL (ref 30–89)

## 2013-05-06 ENCOUNTER — Telehealth: Payer: Self-pay | Admitting: Orthopedic Surgery

## 2013-05-06 NOTE — Telephone Encounter (Addendum)
LMTCB about appt.  aa    (Need to tell her about appt at [facility] for follow up after lap band procedure 05-31-13 arriving at 3:30 for 4:00 appt. [phone] to reschedule.) (Also, need to tell her about appt with [facility] Gottron for pelvic PT 05-17-13 at 4 pm. [facility] [phone] to reschedule. [address].)

## 2013-05-10 ENCOUNTER — Telehealth: Payer: Self-pay | Admitting: *Deleted

## 2013-05-10 NOTE — Telephone Encounter (Signed)
Left message on CELL#VM of need to call office for appt. Date with [facility], Ruben Gottron.

## 2013-05-11 ENCOUNTER — Encounter: Payer: Self-pay | Admitting: Obstetrics and Gynecology

## 2013-05-16 ENCOUNTER — Telehealth: Payer: Self-pay | Admitting: *Deleted

## 2013-05-16 NOTE — Telephone Encounter (Signed)
We will wait to hear back from the patient regarding the insurance approved list of medications to choose from.

## 2013-05-16 NOTE — Telephone Encounter (Signed)
Patient notified of appt. With Ruben Gottron for 05/17/2013 @ 4;00pm. Also patient notified of need to call her insurance company concerning her coverage of insurance with [employer] [employer]. . Called [employer] to get PA for Vesicare and was told she is not on the members plan. Patient states will call herself to check on this when she is off tomorrow. Patient also states she has tried other medications of oxybutrynin and Detrol that caused reactions and migraine aura.

## 2013-05-17 ENCOUNTER — Ambulatory Visit
Admission: RE | Admit: 2013-05-17 | Discharge: 2013-05-17 | Disposition: A | Discharge status: Home / Self Care | Payer: PRIVATE HEALTH INSURANCE | Source: Ambulatory Visit | Attending: Obstetrics and Gynecology | Admitting: Obstetrics and Gynecology

## 2013-05-17 DIAGNOSIS — Z1239 Encounter for other screening for malignant neoplasm of breast: Secondary | ICD-10-CM

## 2013-05-31 ENCOUNTER — Encounter (INDEPENDENT_AMBULATORY_CARE_PROVIDER_SITE_OTHER): Payer: Self-pay | Admitting: Surgery

## 2013-05-31 ENCOUNTER — Ambulatory Visit (INDEPENDENT_AMBULATORY_CARE_PROVIDER_SITE_OTHER): Payer: PRIVATE HEALTH INSURANCE | Admitting: Surgery

## 2013-05-31 VITALS — BP 150/82 | HR 74 | Resp 16 | Ht 67.0 in | Wt 253.4 lb

## 2013-05-31 DIAGNOSIS — Z9884 Bariatric surgery status: Secondary | ICD-10-CM | POA: Insufficient documentation

## 2013-05-31 NOTE — Progress Notes (Signed)
Lapband Fill Encounter Problem List:   Patient Active Problem List   Diagnosis Date Noted  . Lapband 10 cm + Vagotomy Nov 2006 05/31/2013    Amy Ritter Body mass index is 39.68 kg/(m^2). Weight loss since surgery  None her preoperative BMI was 44. Today his BMI is 39.6.  Having regurgitation?: no  Feel that they need a fill?  yes  Nocturnal reflux?  No  Amount of fill  1cc     Instructions given and weight loss goals discussed.    This is a followup visit for Amy Ritter who is a lap band vagotomy patient. She was done by Dr. Simona Huh on 09/02/2005. Her old [facility]CS number is [phone]. Preop weight was 280 with a height of 67 inches and BMI of 43.8. At that time she had GERD. She was last seen in our office by my records on 03/20/2010. At that time she was 55 months postop or 4.6 years and had lost 31% of her excess weight or 46 pounds. She had some questions about band fill and had not been filled and a long time. I went ahead and accessed report and she had about 100 cc intervals and. I went ahead and filled her to 1 cc and we'll see how she does with that. LC her back in 6 weeks. I don't want to overfill her.  Matt B. Daphine Deutscher, MD, FACS

## 2013-05-31 NOTE — Patient Instructions (Signed)

## 2013-07-27 ENCOUNTER — Encounter (INDEPENDENT_AMBULATORY_CARE_PROVIDER_SITE_OTHER): Payer: Self-pay | Admitting: Surgery

## 2013-07-27 ENCOUNTER — Ambulatory Visit (INDEPENDENT_AMBULATORY_CARE_PROVIDER_SITE_OTHER): Payer: PRIVATE HEALTH INSURANCE | Admitting: Surgery

## 2013-07-27 VITALS — BP 136/82 | HR 66 | Temp 98.0°F | Resp 18 | Ht 67.0 in | Wt 243.0 lb

## 2013-07-27 DIAGNOSIS — Z4651 Encounter for fitting and adjustment of gastric lap band: Secondary | ICD-10-CM | POA: Insufficient documentation

## 2013-07-27 NOTE — Patient Instructions (Signed)

## 2013-07-27 NOTE — Progress Notes (Signed)
Lapband Fill Encounter Problem List:   Patient Active Problem List   Diagnosis Date Noted  . Lapband 10 cm + Vagotomy Nov 2006 05/31/2013    Amy Ritter Body mass index is 38.05 kg/(m^2). Weight loss since surgery  37.2  Having regurgitation?:  no  Feel that they need a fill?  yes  Nocturnal reflux?  no  Amount of fill  0.5     Instructions given and weight loss goals discussed.    She had 1 cc in her band.  When I got up to 2 cc she could feel it.  I filled her to 1.5 cc.  We talked about what to look for in her diet.  She wants to see me in 4 weeks.   Matt B. Daphine Deutscher, MD, FACS

## 2013-08-03 ENCOUNTER — Ambulatory Visit: Payer: PRIVATE HEALTH INSURANCE | Admitting: Obstetrics and Gynecology

## 2013-08-03 ENCOUNTER — Encounter: Payer: Self-pay | Admitting: Obstetrics and Gynecology

## 2013-08-03 ENCOUNTER — Ambulatory Visit (INDEPENDENT_AMBULATORY_CARE_PROVIDER_SITE_OTHER): Payer: PRIVATE HEALTH INSURANCE | Admitting: Obstetrics and Gynecology

## 2013-08-03 VITALS — BP 124/80 | HR 60 | Ht 66.0 in | Wt 242.0 lb

## 2013-08-03 DIAGNOSIS — N3946 Mixed incontinence: Secondary | ICD-10-CM

## 2013-08-03 NOTE — Patient Instructions (Signed)
Return for incontinence care as needed!

## 2013-08-03 NOTE — Progress Notes (Signed)
Patient ID: Amy Ritter, female   DOB: 11-Mar-1961, 52 y.o.   MRN: [id number]  Subjective  Here for incontinence recheck.   Status post TAH.  Status post insitu midurethral sling?  Status post Burch procedure for urinary incontinence.  Patient states that physical therapy has changed her life.  Amy Ritter at [facility]. Sees improvement in stress and urge incontinence.  Also improved sacral pain.   Stopped Ditropan XL.  Had chest and jaw pain.  Had elevated blood pressure as well. Felt like she was having a heart attack. Did not take Vesicare due to insurance lack of approval.   Lost 14 pounds.  Saw Amy Ritter.  Had lap band filled twice.   Had normal mammogram result since last visit.   Objective  No exam  Assessment  Mixed incontinence improved with physical therapy.  Status post two anti-incontinence procedures.  Reaction to Ditropan.  Obesity.    Plan    Continue Physical Therapy for pelvic floor rehabilitation.  No antimuscarinic therapy at this time.  Continue with weight loss. Annual exam with Amy Ritter in November. Follow up for incontinence issues prn.   After visit summary to patient.

## 2013-08-18 ENCOUNTER — Other Ambulatory Visit: Payer: Self-pay

## 2013-08-30 ENCOUNTER — Ambulatory Visit (INDEPENDENT_AMBULATORY_CARE_PROVIDER_SITE_OTHER): Payer: PRIVATE HEALTH INSURANCE | Admitting: Certified Nurse Midwife

## 2013-08-30 ENCOUNTER — Ambulatory Visit (INDEPENDENT_AMBULATORY_CARE_PROVIDER_SITE_OTHER): Payer: PRIVATE HEALTH INSURANCE | Admitting: Surgery

## 2013-08-30 ENCOUNTER — Encounter: Payer: Self-pay | Admitting: Certified Nurse Midwife

## 2013-08-30 ENCOUNTER — Encounter (INDEPENDENT_AMBULATORY_CARE_PROVIDER_SITE_OTHER): Payer: Self-pay | Admitting: Surgery

## 2013-08-30 VITALS — BP 114/68 | HR 72 | Resp 16 | Ht 66.0 in | Wt 241.0 lb

## 2013-08-30 VITALS — BP 128/64 | HR 60 | Resp 16 | Ht 67.0 in | Wt 241.2 lb

## 2013-08-30 DIAGNOSIS — Z4651 Encounter for fitting and adjustment of gastric lap band: Secondary | ICD-10-CM

## 2013-08-30 DIAGNOSIS — Z01419 Encounter for gynecological examination (general) (routine) without abnormal findings: Secondary | ICD-10-CM

## 2013-08-30 DIAGNOSIS — Z0000 Encounter for general adult medical examination without abnormal findings: Secondary | ICD-10-CM
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
## 2013-08-30 NOTE — Patient Instructions (Signed)

## 2013-08-30 NOTE — Patient Instructions (Signed)

## 2013-08-30 NOTE — Progress Notes (Signed)
Lapband Fill Encounter Problem List:   Patient Active Problem List   Diagnosis Date Noted  . Fitting and adjustment of gastric lap band 07/27/2013  . Lapband 10 cm + Vagotomy Nov 2006 05/31/2013    Soledad Gerlach Body mass index is 37.77 kg/(m^2). Weight loss since surgery  39  Having regurgitation?:  no  Feel that they need a fill?  yes  Nocturnal reflux?  no  Amount of fill  0.5     Instructions given and weight loss goals discussed.    Having a lot of stress with first grandchild on the way.    Matt B. Daphine Deutscher, MD, FACS

## 2013-09-01 NOTE — Progress Notes (Signed)
Note reviewed, agree with plan.  Marqual Mi, MD  

## 2013-10-27 ENCOUNTER — Encounter (INDEPENDENT_AMBULATORY_CARE_PROVIDER_SITE_OTHER): Payer: PRIVATE HEALTH INSURANCE | Admitting: Surgery

## 2013-11-09 ENCOUNTER — Encounter (INDEPENDENT_AMBULATORY_CARE_PROVIDER_SITE_OTHER): Payer: Self-pay | Admitting: Surgery

## 2013-11-09 ENCOUNTER — Ambulatory Visit (INDEPENDENT_AMBULATORY_CARE_PROVIDER_SITE_OTHER): Payer: PRIVATE HEALTH INSURANCE | Admitting: Surgery

## 2013-11-09 VITALS — BP 125/88 | HR 74 | Temp 98.0°F | Resp 16 | Ht 67.0 in | Wt 234.0 lb

## 2013-11-09 DIAGNOSIS — Z6836 Body mass index (BMI) 36.0-36.9, adult: Secondary | ICD-10-CM

## 2013-11-09 DIAGNOSIS — Z09 Encounter for follow-up examination after completed treatment for conditions other than malignant neoplasm: Secondary | ICD-10-CM

## 2013-11-09 DIAGNOSIS — Z9884 Bariatric surgery status: Secondary | ICD-10-CM

## 2013-11-09 DIAGNOSIS — Z4651 Encounter for fitting and adjustment of gastric lap band: Secondary | ICD-10-CM

## 2013-11-09 NOTE — Progress Notes (Signed)
Lapband Fill Encounter Problem List:   Patient Active Problem List   Diagnosis Date Noted  . Fitting and adjustment of gastric lap band 07/27/2013  . Lapband 10 cm + Vagotomy Nov 2006 05/31/2013    Franklyn Lor Body mass index is 36.64 kg/(m^2). Weight loss since surgery  46.2  Having regurgitation?:  no  Feel that they need a fill?  yes  Nocturnal reflux?  no  Amount of fill  0.2     Instructions given and weight loss goals discussed.    She is a new grandmother.  Has been under some stress.  She is a band vagotomy patient.  Return 2 months.   Matt B. Hassell Done, MD, FACS

## 2013-11-09 NOTE — Patient Instructions (Signed)

## 2013-12-28 ENCOUNTER — Encounter (INDEPENDENT_AMBULATORY_CARE_PROVIDER_SITE_OTHER): Payer: Self-pay | Admitting: Surgery

## 2014-01-04 ENCOUNTER — Telehealth (INDEPENDENT_AMBULATORY_CARE_PROVIDER_SITE_OTHER): Payer: Self-pay

## 2014-01-04 ENCOUNTER — Encounter (INDEPENDENT_AMBULATORY_CARE_PROVIDER_SITE_OTHER): Payer: PRIVATE HEALTH INSURANCE | Admitting: Surgery

## 2014-01-04 NOTE — Telephone Encounter (Signed)
Called and spoke to patient regarding appointment for lap band fill.  Patient concerned about pre-authorization from her insurance.  Noted in Specialty Comments patient's insurance has approved 7 visits until October 2015.  Patient appointment has been r/s to 02/01/14 @ 10:15 am w/Dr. Hassell Done.

## 2014-01-05 ENCOUNTER — Encounter (INDEPENDENT_AMBULATORY_CARE_PROVIDER_SITE_OTHER): Payer: PRIVATE HEALTH INSURANCE | Admitting: Surgery

## 2014-02-01 ENCOUNTER — Ambulatory Visit (INDEPENDENT_AMBULATORY_CARE_PROVIDER_SITE_OTHER): Payer: PRIVATE HEALTH INSURANCE | Admitting: Surgery

## 2014-02-01 ENCOUNTER — Encounter (INDEPENDENT_AMBULATORY_CARE_PROVIDER_SITE_OTHER): Payer: Self-pay | Admitting: Surgery

## 2014-02-01 VITALS — BP 118/78 | HR 71 | Temp 98.3°F | Resp 16 | Ht 67.0 in | Wt 229.4 lb

## 2014-02-01 DIAGNOSIS — Z9884 Bariatric surgery status: Secondary | ICD-10-CM

## 2014-02-01 NOTE — Patient Instructions (Signed)
Good Carbs:  Brocccoli, asparagus, spinach, mustard greens,    Intermediate:  Beans  Bad Carbs: Potatoes (white, sweet, or French Fries), carrots, squash,  

## 2014-02-01 NOTE — Progress Notes (Signed)
Lapband Fill Encounter Problem List:   Patient Active Problem List   Diagnosis Date Noted  . Fitting and adjustment of gastric lap band 07/27/2013  . Lapband 10 cm + Vagotomy Nov 2006 05/31/2013    Franklyn Lor Body mass index is 35.92 kg/(m^2). Weight loss since surgery  50.8  Having regurgitation?:  No except when she eats too fast  Feel that they need a fill?  unsure  Nocturnal reflux?  no  Amount of fill  0     Instructions given and weight loss goals discussed.    She is stress eating when she keeps her granddaughter at night.  She will be going to [place] to care for second grandchild soon.  Discussed what not to eat and how to eat.  I think that she is tight enough.  She is a band vagotomy patient.   Matt B. Hassell Done, MD, FACS

## 2014-02-22 ENCOUNTER — Encounter (INDEPENDENT_AMBULATORY_CARE_PROVIDER_SITE_OTHER): Payer: PRIVATE HEALTH INSURANCE | Admitting: Surgery

## 2014-03-23 ENCOUNTER — Other Ambulatory Visit: Payer: Self-pay | Admitting: Certified Nurse Midwife

## 2014-03-23 ENCOUNTER — Telehealth: Payer: Self-pay | Admitting: Certified Nurse Midwife

## 2014-03-23 DIAGNOSIS — E559 Vitamin D deficiency, unspecified: Secondary | ICD-10-CM

## 2014-03-23 NOTE — Telephone Encounter (Signed)
Spoke with patient. Advised of message from [place]. Patient requesting mid-morning appointment next Tuesday 6/16.Lab appointment schedule for next Tuesday 6/16 at 11:00am. Patient agreeable to date and time.  Routing to provider for final review. Patient agreeable to disposition. Will close encounter

## 2014-03-23 NOTE — Telephone Encounter (Signed)
Regina Eck CNM, order for vitamin d check pending your approval before patient's aex on 09/12/14.

## 2014-03-23 NOTE — Telephone Encounter (Signed)
Left message to call Kaitlyn at [phone]. 

## 2014-03-23 NOTE — Telephone Encounter (Signed)
OK to check now. Order placed. Please let patient know.

## 2014-03-23 NOTE — Telephone Encounter (Signed)
Patient is asking for an appointment to have her vitamin d checked. Patient's next aex is 09/12/14. Patient does not want to wait until her aex to have this checked.

## 2014-03-28 ENCOUNTER — Ambulatory Visit (INDEPENDENT_AMBULATORY_CARE_PROVIDER_SITE_OTHER): Payer: PRIVATE HEALTH INSURANCE | Admitting: *Deleted

## 2014-03-28 DIAGNOSIS — E559 Vitamin D deficiency, unspecified: Secondary | ICD-10-CM

## 2014-03-29 LAB — VITAMIN D 25 HYDROXY (VIT D DEFICIENCY, FRACTURES): Vit D, 25-Hydroxy: 32 ng/mL (ref 30–89)

## 2014-07-04 ENCOUNTER — Other Ambulatory Visit: Payer: Self-pay

## 2014-07-04 DIAGNOSIS — Z1231 Encounter for screening mammogram for malignant neoplasm of breast: Secondary | ICD-10-CM

## 2014-07-18 ENCOUNTER — Ambulatory Visit
Admission: RE | Admit: 2014-07-18 | Discharge: 2014-07-18 | Disposition: A | Discharge status: Home / Self Care | Payer: PRIVATE HEALTH INSURANCE | Source: Ambulatory Visit

## 2014-07-18 DIAGNOSIS — Z1231 Encounter for screening mammogram for malignant neoplasm of breast: Secondary | ICD-10-CM

## 2014-08-14 ENCOUNTER — Encounter (INDEPENDENT_AMBULATORY_CARE_PROVIDER_SITE_OTHER): Payer: Self-pay | Admitting: Surgery

## 2014-09-12 ENCOUNTER — Encounter: Payer: Self-pay | Admitting: Certified Nurse Midwife

## 2014-09-12 ENCOUNTER — Ambulatory Visit (INDEPENDENT_AMBULATORY_CARE_PROVIDER_SITE_OTHER): Payer: PRIVATE HEALTH INSURANCE | Admitting: Certified Nurse Midwife

## 2014-09-12 VITALS — BP 116/70 | HR 68 | Resp 16 | Ht 65.75 in | Wt 222.0 lb

## 2014-09-12 DIAGNOSIS — N390 Urinary tract infection, site not specified: Secondary | ICD-10-CM

## 2014-09-12 DIAGNOSIS — Z0000 Encounter for general adult medical examination without abnormal findings: Secondary | ICD-10-CM

## 2014-09-12 DIAGNOSIS — Z01419 Encounter for gynecological examination (general) (routine) without abnormal findings: Secondary | ICD-10-CM

## 2014-09-12 LAB — POCT URINALYSIS DIPSTICK
BILIRUBIN UA: NEGATIVE
GLUCOSE UA: NEGATIVE
KETONES UA: NEGATIVE
Nitrite, UA: POSITIVE
Protein, UA: NEGATIVE
Urobilinogen, UA: NEGATIVE
pH, UA: 5

## 2014-09-12 LAB — HEMOGLOBIN, FINGERSTICK: Hemoglobin, fingerstick: 13.6 g/dL (ref 12.0–16.0)

## 2014-09-12 MED ORDER — PHENAZOPYRIDINE HCL 100 MG PO TABS: 100.0000 mg | ORAL_TABLET | Freq: Three times a day (TID) | ORAL | Status: DC | PRN

## 2014-09-12 MED ORDER — CIPROFLOXACIN HCL 500 MG PO TABS: 500.0000 mg | ORAL_TABLET | Freq: Two times a day (BID) | ORAL | Status: DC

## 2014-09-12 NOTE — Progress Notes (Signed)
Reviewed personally.  M. Suzanne Caree Wolpert, MD.  

## 2014-09-12 NOTE — Patient Instructions (Signed)
EXERCISE AND DIET:  We recommended that you start or continue a regular exercise program for good health. Regular exercise means any activity that makes your heart beat faster and makes you sweat.  We recommend exercising at least 30 minutes per day at least 3 days a week, preferably 4 or 5.  We also recommend a diet low in fat and sugar.  Inactivity, poor dietary choices and obesity can cause diabetes, heart attack, stroke, and kidney damage, among others.    ALCOHOL AND SMOKING:  Women should limit their alcohol intake to no more than 7 drinks/beers/glasses of wine (combined, not each!) per week. Moderation of alcohol intake to this level decreases your risk of breast cancer and liver damage. And of course, no recreational drugs are part of a healthy lifestyle.  And absolutely no smoking or even second hand smoke. Most people know smoking can cause heart and lung diseases, but did you know it also contributes to weakening of your bones? Aging of your skin?  Yellowing of your teeth and nails?  CALCIUM AND VITAMIN D:  Adequate intake of calcium and Vitamin D are recommended.  The recommendations for exact amounts of these supplements seem to change often, but generally speaking 600 mg of calcium (either carbonate or citrate) and 800 units of Vitamin D per day seems prudent. Certain women may benefit from higher intake of Vitamin D.  If you are among these women, your doctor will have told you during your visit.    PAP SMEARS:  Pap smears, to check for cervical cancer or precancers,  have traditionally been done yearly, although recent scientific advances have shown that most women can have pap smears less often.  However, every woman still should have a physical exam from her gynecologist every year. It will include a breast check, inspection of the vulva and vagina to check for abnormal growths or skin changes, a visual exam of the cervix, and then an exam to evaluate the size and shape of the uterus and  ovaries.  And after 53 years of age, a rectal exam is indicated to check for rectal cancers. We will also provide age appropriate advice regarding health maintenance, like when you should have certain vaccines, screening for sexually transmitted diseases, bone density testing, colonoscopy, mammograms, etc.   MAMMOGRAMS:  All women over 40 years old should have a yearly mammogram. Many facilities now offer a "3D" mammogram, which may cost around $50 extra out of pocket. If possible,  we recommend you accept the option to have the 3D mammogram performed.  It both reduces the number of women who will be called back for extra views which then turn out to be normal, and it is better than the routine mammogram at detecting truly abnormal areas.    COLONOSCOPY:  Colonoscopy to screen for colon cancer is recommended for all women at age 50.  We know, you hate the idea of the prep.  We agree, BUT, having colon cancer and not knowing it is worse!!  Colon cancer so often starts as a polyp that can be seen and removed at colonscopy, which can quite literally save your life!  And if your first colonoscopy is normal and you have no family history of colon cancer, most women don't have to have it again for 10 years.  Once every ten years, you can do something that may end up saving your life, right?  We will be happy to help you get it scheduled when you are ready.    Be sure to check your insurance coverage so you understand how much it will cost.  It may be covered as a preventative service at no cost, but you should check your particular policy.     Urinary Tract Infection Urinary tract infections (UTIs) can develop anywhere along your urinary tract. Your urinary tract is your body's drainage system for removing wastes and extra water. Your urinary tract includes two kidneys, two ureters, a bladder, and a urethra. Your kidneys are a pair of bean-shaped organs. Each kidney is about the size of your fist. They are located  below your ribs, one on each side of your spine. CAUSES Infections are caused by microbes, which are microscopic organisms, including fungi, viruses, and bacteria. These organisms are so small that they can only be seen through a microscope. Bacteria are the microbes that most commonly cause UTIs. SYMPTOMS  Symptoms of UTIs may vary by age and gender of the patient and by the location of the infection. Symptoms in young women typically include a frequent and intense urge to urinate and a painful, burning feeling in the bladder or urethra during urination. Older women and men are more likely to be tired, shaky, and weak and have muscle aches and abdominal pain. A fever may mean the infection is in your kidneys. Other symptoms of a kidney infection include pain in your back or sides below the ribs, nausea, and vomiting. DIAGNOSIS To diagnose a UTI, your caregiver will ask you about your symptoms. Your caregiver also will ask to provide a urine sample. The urine sample will be tested for bacteria and white blood cells. White blood cells are made by your body to help fight infection. TREATMENT  Typically, UTIs can be treated with medication. Because most UTIs are caused by a bacterial infection, they usually can be treated with the use of antibiotics. The choice of antibiotic and length of treatment depend on your symptoms and the type of bacteria causing your infection. HOME CARE INSTRUCTIONS  If you were prescribed antibiotics, take them exactly as your caregiver instructs you. Finish the medication even if you feel better after you have only taken some of the medication.  Drink enough water and fluids to keep your urine clear or pale yellow.  Avoid caffeine, tea, and carbonated beverages. They tend to irritate your bladder.  Empty your bladder often. Avoid holding urine for long periods of time.  Empty your bladder before and after sexual intercourse.  After a bowel movement, women should cleanse  from front to back. Use each tissue only once. SEEK MEDICAL CARE IF:   You have back pain.  You develop a fever.  Your symptoms do not begin to resolve within 3 days. SEEK IMMEDIATE MEDICAL CARE IF:   You have severe back pain or lower abdominal pain.  You develop chills.  You have nausea or vomiting.  You have continued burning or discomfort with urination. MAKE SURE YOU:   Understand these instructions.  Will watch your condition.  Will get help right away if you are not doing well or get worse. Document Released: 07/09/2005 Document Revised: 03/30/2012 Document Reviewed: 11/07/2011 [employer] Patient Information 2015 [employer]. This information is not intended to replace advice given to you by your health care provider. Make sure you discuss any questions you have with your health care provider.  
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
## 2014-09-13 LAB — HEMOGLOBIN A1C
HEMOGLOBIN A1C: 5.3 % (ref <5.7)
Mean Plasma Glucose: 105 mg/dL (ref <117)

## 2014-09-13 LAB — URINALYSIS, MICROSCOPIC ONLY
Casts: NONE SEEN
Crystals: NONE SEEN
SQUAMOUS EPITHELIAL / LPF: NONE SEEN

## 2014-09-13 LAB — LIPID PANEL
CHOL/HDL RATIO: 3.7 ratio
CHOLESTEROL: 190 mg/dL (ref 0–200)
HDL: 52 mg/dL (ref >39)
LDL Cholesterol: 119 mg/dL — ABNORMAL HIGH (ref 0–99)
TRIGLYCERIDES: 94 mg/dL (ref <150)
VLDL: 19 mg/dL (ref 0–40)

## 2014-09-13 LAB — VITAMIN D 25 HYDROXY (VIT D DEFICIENCY, FRACTURES): Vit D, 25-Hydroxy: 22 ng/mL — ABNORMAL LOW (ref 30–100)

## 2014-09-15 LAB — URINE CULTURE

## 2014-09-17 NOTE — Addendum Note (Signed)
Addended by: Regina Eck on: 09/17/2014 04:13 PM   Modules accepted: Orders, SmartSet

## 2014-09-18 ENCOUNTER — Encounter: Payer: Self-pay | Admitting: Certified Nurse Midwife

## 2014-09-18 ENCOUNTER — Telehealth: Payer: Self-pay

## 2014-09-18 NOTE — Telephone Encounter (Signed)
-----   Message from Regina Eck, CNM sent at 09/17/2014  4:12 PM EST ----- Notify patient that urine micro was positive for bacteria and culture positive for E. Coli on appropriate medication 2 week TOC, no OV needed order in Lipid panel optimal level Vitamin D low per protocol Hgb A1-c normal

## 2014-09-18 NOTE — Telephone Encounter (Signed)
Unable to leave voice mail. Try again.

## 2014-09-19 NOTE — Telephone Encounter (Signed)
Patient notified of results as written by provider. See lab

## 2014-09-19 NOTE — Telephone Encounter (Signed)
Pt returned recent call about lab results. Also see email regarding this.    bf

## 2014-09-26 ENCOUNTER — Ambulatory Visit (INDEPENDENT_AMBULATORY_CARE_PROVIDER_SITE_OTHER): Payer: PRIVATE HEALTH INSURANCE | Admitting: *Deleted

## 2014-09-26 DIAGNOSIS — N390 Urinary tract infection, site not specified: Secondary | ICD-10-CM

## 2014-09-26 NOTE — Progress Notes (Signed)
Patient is here for urine TOC, patient states she is feeling 100 times better than she was before.  Urine culture/micro sent to lab   Patient aware someone will contact her when results are in.  Routed to provider for review, encounter closed.

## 2014-09-27 LAB — URINALYSIS, MICROSCOPIC ONLY
Casts: NONE SEEN
Crystals: NONE SEEN

## 2014-09-28 ENCOUNTER — Telehealth: Payer: Self-pay

## 2014-09-28 LAB — URINE CULTURE

## 2014-09-28 NOTE — Telephone Encounter (Signed)
-----   Message from Regina Eck, CNM sent at 09/28/2014  1:11 PM EST ----- Notify patient that urine micro negative Culture shows multi bacteria, but none predominant so negative no indication of UTI Any vaginal symptoms?

## 2014-09-28 NOTE — Telephone Encounter (Signed)
Patient returning call.

## 2014-09-28 NOTE — Telephone Encounter (Signed)
Patient notified of results as written by provider 

## 2014-09-28 NOTE — Telephone Encounter (Signed)
[email or web address]

## 2015-04-25 ENCOUNTER — Encounter: Payer: Self-pay | Admitting: Podiatry

## 2015-04-25 ENCOUNTER — Ambulatory Visit (INDEPENDENT_AMBULATORY_CARE_PROVIDER_SITE_OTHER): Payer: PRIVATE HEALTH INSURANCE | Admitting: Podiatry

## 2015-04-25 VITALS — BP 150/84 | HR 66 | Ht 66.0 in | Wt 230.0 lb

## 2015-04-25 DIAGNOSIS — M722 Plantar fascial fibromatosis: Secondary | ICD-10-CM

## 2015-04-25 DIAGNOSIS — M216X9 Other acquired deformities of unspecified foot: Secondary | ICD-10-CM | POA: Diagnosis not present

## 2015-04-25 DIAGNOSIS — M24273 Disorder of ligament, unspecified ankle: Secondary | ICD-10-CM | POA: Diagnosis not present

## 2015-04-25 NOTE — Progress Notes (Signed)
Subjective: 54 year old female presents with complaining of heel pain and plantar fasciitis on right side off and on for 5 years. Limping now from pain. Wears [place] that has helped instep area. Does not go on barefoot. Left lateral shin area is swelling now. 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 Has a grand baby she carries often. Diagnosed with Fibromyalgia since 1993 after having twins.   Review of Systems - General ROS: negative for - chills, fatigue, fever, night sweats, sleep disturbance or weight loss Ophthalmic ROS: negative ENT ROS: negative Allergy and Immunology ROS: negative Endocrine ROS: negative Breast ROS: had done a bipsy that came out negative. Respiratory ROS: no cough, shortness of breath, or wheezing Cardiovascular ROS: no chest pain or dyspnea on exertion Gastrointestinal ROS: Has had Gall blader removal, Lap band put in.  Genito-Urinary ROS: no dysuria, trouble voiding, or hematuria Musculoskeletal ROS: Suffering from Fibromyalgia that affects hip joints. Neurological ROS: negative Dermatological ROS: negative.  Objective: Dermatologic: Normal findings. Neurovascular status are within normal. Orthopedic:  Ligamentous laxity with excess subtalar joint inversion and eversion motion bilateral,  High arched cavus type foot.  Plantar fasciitis right, Hypermobile first ray bilateral. Dorsal spur first MCJ.  Assessment: Plantar fasciitis right. Excess STJ pronation right. Ligamentous laxity bilateral. Hypermobile first ray bilateral.  Plan: Reviewed clinical findings and available treatment options. Ankle brace dispensed right. Will benefit from custom orthotics.

## 2015-04-25 NOTE — Patient Instructions (Signed)
Seen for right heel and arch pain. Noted of ligamentous laxity and hyperpronation. Ankle brace dispensed. Need custom orthotics. Return in 3 weeks.

## 2015-05-15 ENCOUNTER — Ambulatory Visit (INDEPENDENT_AMBULATORY_CARE_PROVIDER_SITE_OTHER): Payer: PRIVATE HEALTH INSURANCE | Admitting: Podiatry

## 2015-05-15 ENCOUNTER — Encounter: Payer: Self-pay | Admitting: Podiatry

## 2015-05-15 VITALS — BP 150/85 | HR 64

## 2015-05-15 DIAGNOSIS — M722 Plantar fascial fibromatosis: Secondary | ICD-10-CM | POA: Diagnosis not present

## 2015-05-15 DIAGNOSIS — M21969 Unspecified acquired deformity of unspecified lower leg: Secondary | ICD-10-CM | POA: Diagnosis not present

## 2015-05-15 DIAGNOSIS — M216X9 Other acquired deformities of unspecified foot: Secondary | ICD-10-CM

## 2015-05-15 NOTE — Patient Instructions (Signed)
Both feet casted for orthotics. Will call when they are ready.

## 2015-05-15 NOTE — Progress Notes (Signed)
Returned for orthotics and X-rays. Ankle brace helped a great deal. Still having some pain on right heel.   X-rays taken on both feet. Findings show rectus foot, minimum change in lateral deviation of the calcaneocuboid angle. Positive findings of short first ray in AP view, elevated first ray bilateral.   Assessment: Hypermobile first ray, elevated and short first ray, with STJ hyperpronation.   Plan: Both feet casted for orthotics. Continue with ankle brace as needed.

## 2015-05-30 DIAGNOSIS — Z8589 Personal history of malignant neoplasm of other organs and systems: Secondary | ICD-10-CM | POA: Insufficient documentation

## 2015-05-30 DIAGNOSIS — R030 Elevated blood-pressure reading, without diagnosis of hypertension: Secondary | ICD-10-CM

## 2015-05-30 DIAGNOSIS — IMO0001 Reserved for inherently not codable concepts without codable children: Secondary | ICD-10-CM | POA: Insufficient documentation

## 2015-08-29 DIAGNOSIS — R002 Palpitations: Secondary | ICD-10-CM | POA: Insufficient documentation

## 2015-09-18 ENCOUNTER — Ambulatory Visit: Payer: PRIVATE HEALTH INSURANCE | Admitting: Certified Nurse Midwife

## 2015-10-23 ENCOUNTER — Other Ambulatory Visit: Payer: Self-pay | Admitting: Certified Nurse Midwife

## 2015-10-23 ENCOUNTER — Encounter: Payer: Self-pay | Admitting: Certified Nurse Midwife

## 2015-10-23 ENCOUNTER — Ambulatory Visit (INDEPENDENT_AMBULATORY_CARE_PROVIDER_SITE_OTHER): Payer: PRIVATE HEALTH INSURANCE | Admitting: Certified Nurse Midwife

## 2015-10-23 VITALS — BP 110/70 | HR 72 | Resp 16 | Ht 65.75 in | Wt 234.0 lb

## 2015-10-23 DIAGNOSIS — Z01419 Encounter for gynecological examination (general) (routine) without abnormal findings: Secondary | ICD-10-CM

## 2015-10-23 DIAGNOSIS — Z0000 Encounter for general adult medical examination without abnormal findings: Secondary | ICD-10-CM | POA: Diagnosis not present

## 2015-10-23 DIAGNOSIS — E669 Obesity, unspecified: Secondary | ICD-10-CM

## 2015-10-23 DIAGNOSIS — Z1231 Encounter for screening mammogram for malignant neoplasm of breast: Secondary | ICD-10-CM

## 2015-10-23 LAB — POCT URINALYSIS DIPSTICK
BILIRUBIN UA: NEGATIVE
GLUCOSE UA: NEGATIVE
KETONES UA: NEGATIVE
Leukocytes, UA: NEGATIVE
Nitrite, UA: NEGATIVE
PH UA: 5
Protein, UA: NEGATIVE
RBC UA: NEGATIVE
Urobilinogen, UA: NEGATIVE
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
## 2015-10-23 NOTE — Progress Notes (Signed)
Reviewed personally.  M. Suzanne Bradd Merlos, MD.  

## 2015-10-23 NOTE — Patient Instructions (Addendum)
EXERCISE AND DIET:  We recommended that you start or continue a regular exercise program for good health. Regular exercise means any activity that makes your heart beat faster and makes you sweat.  We recommend exercising at least 30 minutes per day at least 3 days a week, preferably 4 or 5.  We also recommend a diet low in fat and sugar.  Inactivity, poor dietary choices and obesity can cause diabetes, heart attack, stroke, and kidney damage, among others.    ALCOHOL AND SMOKING:  Women should limit their alcohol intake to no more than 7 drinks/beers/glasses of wine (combined, not each!) per week. Moderation of alcohol intake to this level decreases your risk of breast cancer and liver damage. And of course, no recreational drugs are part of a healthy lifestyle.  And absolutely no smoking or even second hand smoke. Most people know smoking can cause heart and lung diseases, but did you know it also contributes to weakening of your bones? Aging of your skin?  Yellowing of your teeth and nails?  CALCIUM AND VITAMIN D:  Adequate intake of calcium and Vitamin D are recommended.  The recommendations for exact amounts of these supplements seem to change often, but generally speaking 600 mg of calcium (either carbonate or citrate) and 800 units of Vitamin D per day seems prudent. Certain women may benefit from higher intake of Vitamin D.  If you are among these women, your doctor will have told you during your visit.    PAP SMEARS:  Pap smears, to check for cervical cancer or precancers,  have traditionally been done yearly, although recent scientific advances have shown that most women can have pap smears less often.  However, every woman still should have a physical exam from her gynecologist every year. It will include a breast check, inspection of the vulva and vagina to check for abnormal growths or skin changes, a visual exam of the cervix, and then an exam to evaluate the size and shape of the uterus and  ovaries.  And after 55 years of age, a rectal exam is indicated to check for rectal cancers. We will also provide age appropriate advice regarding health maintenance, like when you should have certain vaccines, screening for sexually transmitted diseases, bone density testing, colonoscopy, mammograms, etc.   MAMMOGRAMS:  All women over 40 years old should have a yearly mammogram. Many facilities now offer a "3D" mammogram, which may cost around $50 extra out of pocket. If possible,  we recommend you accept the option to have the 3D mammogram performed.  It both reduces the number of women who will be called back for extra views which then turn out to be normal, and it is better than the routine mammogram at detecting truly abnormal areas.    COLONOSCOPY:  Colonoscopy to screen for colon cancer is recommended for all women at age 50.  We know, you hate the idea of the prep.  We agree, BUT, having colon cancer and not knowing it is worse!!  Colon cancer so often starts as a polyp that can be seen and removed at colonscopy, which can quite literally save your life!  And if your first colonoscopy is normal and you have no family history of colon cancer, most women don't have to have it again for 10 years.  Once every ten years, you can do something that may end up saving your life, right?  We will be happy to help you get it scheduled when you are ready.    Be sure to check your insurance coverage so you understand how much it will cost.  It may be covered as a preventative service at no cost, but you should check your particular policy.     Perimenopause Perimenopause is the time when your body begins to move into the menopause (no menstrual period for 12 straight months). It is a natural process. Perimenopause can begin 2-8 years before the menopause and usually lasts for 1 year after the menopause. During this time, your ovaries may or may not produce an egg. The ovaries vary in their production of estrogen and  progesterone hormones each month. This can cause irregular menstrual periods, difficulty getting pregnant, vaginal bleeding between periods, and uncomfortable symptoms. CAUSES  Irregular production of the ovarian hormones, estrogen and progesterone, and not ovulating every month.  Other causes include:  Tumor of the pituitary gland in the brain.  Medical disease that affects the ovaries.  Radiation treatment.  Chemotherapy.  Unknown causes.  Heavy smoking and excessive alcohol intake can bring on perimenopause sooner. SIGNS AND SYMPTOMS   Hot flashes.  Night sweats.  Irregular menstrual periods.  Decreased sex drive.  Vaginal dryness.  Headaches.  Mood swings.  Depression.  Memory problems.  Irritability.  Tiredness.  Weight gain.  Trouble getting pregnant.  The beginning of losing bone cells (osteoporosis).  The beginning of hardening of the arteries (atherosclerosis). DIAGNOSIS  Your health care provider will make a diagnosis by analyzing your age, menstrual history, and symptoms. He or she will do a physical exam and note any changes in your body, especially your female organs. Female hormone tests may or may not be helpful depending on the amount of female hormones you produce and when you produce them. However, other hormone tests may be helpful to rule out other problems. TREATMENT  In some cases, no treatment is needed. The decision on whether treatment is necessary during the perimenopause should be made by you and your health care provider based on how the symptoms are affecting you and your lifestyle. Various treatments are available, such as:  Treating individual symptoms with a specific medicine for that symptom.  Herbal medicines that can help specific symptoms.  Counseling.  Group therapy. HOME CARE INSTRUCTIONS   Keep track of your menstrual periods (when they occur, how heavy they are, how long between periods, and how long they last) as  well as your symptoms and when they started.  Only take over-the-counter or prescription medicines as directed by your health care provider.  Sleep and rest.  Exercise.  Eat a diet that contains calcium (good for your bones) and soy (acts like the estrogen hormone).  Do not smoke.  Avoid alcoholic beverages.  Take vitamin supplements as recommended by your health care provider. Taking vitamin E may help in certain cases.  Take calcium and vitamin D supplements to help prevent bone loss.  Group therapy is sometimes helpful.  Acupuncture may help in some cases. SEEK MEDICAL CARE IF:   You have questions about any symptoms you are having.  You need a referral to a specialist (gynecologist, psychiatrist, or psychologist). SEEK IMMEDIATE MEDICAL CARE IF:   You have vaginal bleeding.  Your period lasts longer than 8 days.  Your periods are recurring sooner than 21 days.  You have bleeding after intercourse.  You have severe depression.  You have pain when you urinate.  You have severe headaches.  You have vision problems.   This information is not intended to replace advice   given to you by your health care provider. Make sure you discuss any questions you have with your health care provider.   Document Released: 11/06/2004 Document Revised: 10/20/2014 Document Reviewed: 04/28/2013 Elsevier Interactive Patient Education 2016 [employer]  

## 2015-11-13 ENCOUNTER — Ambulatory Visit
Admission: RE | Admit: 2015-11-13 | Discharge: 2015-11-13 | Disposition: A | Discharge status: Home / Self Care | Payer: PRIVATE HEALTH INSURANCE | Source: Ambulatory Visit | Attending: Certified Nurse Midwife | Admitting: Certified Nurse Midwife

## 2015-11-13 DIAGNOSIS — Z1231 Encounter for screening mammogram for malignant neoplasm of breast: Secondary | ICD-10-CM

## 2016-04-30 DIAGNOSIS — F411 Generalized anxiety disorder: Secondary | ICD-10-CM | POA: Insufficient documentation

## 2016-10-28 ENCOUNTER — Ambulatory Visit: Payer: PRIVATE HEALTH INSURANCE | Admitting: Certified Nurse Midwife
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
## 2016-12-17 ENCOUNTER — Encounter: Payer: Self-pay | Admitting: Certified Nurse Midwife

## 2016-12-17 ENCOUNTER — Ambulatory Visit (INDEPENDENT_AMBULATORY_CARE_PROVIDER_SITE_OTHER): Payer: PRIVATE HEALTH INSURANCE | Admitting: Certified Nurse Midwife

## 2016-12-17 VITALS — BP 118/72 | HR 70 | Resp 16 | Ht 65.75 in | Wt 239.0 lb

## 2016-12-17 DIAGNOSIS — Z0000 Encounter for general adult medical examination without abnormal findings: Secondary | ICD-10-CM

## 2016-12-17 DIAGNOSIS — Z01419 Encounter for gynecological examination (general) (routine) without abnormal findings: Secondary | ICD-10-CM

## 2016-12-17 DIAGNOSIS — R350 Frequency of micturition: Secondary | ICD-10-CM

## 2016-12-17 LAB — POCT URINALYSIS DIPSTICK
BILIRUBIN UA: NEGATIVE
GLUCOSE UA: NEGATIVE
KETONES UA: NEGATIVE
Leukocytes, UA: NEGATIVE
Nitrite, UA: POSITIVE
PH UA: 5
Protein, UA: NEGATIVE
RBC UA: NEGATIVE
Urobilinogen, UA: NEGATIVE

## 2016-12-17 NOTE — Patient Instructions (Signed)

## 2016-12-18 NOTE — Progress Notes (Signed)
Encounter reviewed Amy Moritz, MD   

## 2016-12-19 LAB — URINE CULTURE

## 2016-12-20 ENCOUNTER — Other Ambulatory Visit: Payer: Self-pay | Admitting: Certified Nurse Midwife

## 2016-12-20 DIAGNOSIS — N390 Urinary tract infection, site not specified: Secondary | ICD-10-CM

## 2016-12-20 MED ORDER — CIPROFLOXACIN HCL 500 MG PO TABS: 500.0000 mg | ORAL_TABLET | Freq: Two times a day (BID) | ORAL | 0 refills | Status: DC

## 2017-02-11 ENCOUNTER — Other Ambulatory Visit: Payer: Self-pay | Admitting: Certified Nurse Midwife

## 2017-02-11 DIAGNOSIS — Z1231 Encounter for screening mammogram for malignant neoplasm of breast: Secondary | ICD-10-CM

## 2017-03-04 ENCOUNTER — Ambulatory Visit
Admission: RE | Admit: 2017-03-04 | Discharge: 2017-03-04 | Disposition: A | Discharge status: Home / Self Care | Payer: PRIVATE HEALTH INSURANCE | Source: Ambulatory Visit | Attending: Certified Nurse Midwife | Admitting: Certified Nurse Midwife

## 2017-03-04 DIAGNOSIS — Z1231 Encounter for screening mammogram for malignant neoplasm of breast: Secondary | ICD-10-CM

## 2017-12-16 LAB — HM HEPATITIS C SCREENING LAB: HM Hepatitis Screen: NEGATIVE

## 2017-12-23 ENCOUNTER — Ambulatory Visit: Payer: PRIVATE HEALTH INSURANCE | Admitting: Certified Nurse Midwife

## 2018-03-04 ENCOUNTER — Other Ambulatory Visit: Payer: Self-pay | Admitting: Certified Nurse Midwife

## 2018-03-04 DIAGNOSIS — Z1231 Encounter for screening mammogram for malignant neoplasm of breast: Secondary | ICD-10-CM

## 2018-03-30 ENCOUNTER — Ambulatory Visit
Admission: RE | Admit: 2018-03-30 | Discharge: 2018-03-30 | Disposition: A | Discharge status: Home / Self Care | Payer: BLUE CROSS/BLUE SHIELD | Source: Ambulatory Visit | Attending: Certified Nurse Midwife | Admitting: Certified Nurse Midwife

## 2018-03-30 DIAGNOSIS — Z1231 Encounter for screening mammogram for malignant neoplasm of breast: Secondary | ICD-10-CM

## 2018-03-31 ENCOUNTER — Encounter: Payer: Self-pay | Admitting: Certified Nurse Midwife

## 2018-05-11 ENCOUNTER — Encounter: Payer: Self-pay | Admitting: Certified Nurse Midwife

## 2018-05-11 ENCOUNTER — Ambulatory Visit (INDEPENDENT_AMBULATORY_CARE_PROVIDER_SITE_OTHER): Payer: BLUE CROSS/BLUE SHIELD | Admitting: Certified Nurse Midwife

## 2018-05-11 ENCOUNTER — Other Ambulatory Visit: Payer: Self-pay

## 2018-05-11 VITALS — BP 106/64 | HR 68 | Resp 16 | Ht 65.5 in | Wt 248.0 lb

## 2018-05-11 DIAGNOSIS — R350 Frequency of micturition: Secondary | ICD-10-CM | POA: Diagnosis not present

## 2018-05-11 DIAGNOSIS — Z01419 Encounter for gynecological examination (general) (routine) without abnormal findings: Secondary | ICD-10-CM

## 2018-05-11 DIAGNOSIS — Z659 Problem related to unspecified psychosocial circumstances: Secondary | ICD-10-CM

## 2018-05-11 NOTE — Patient Instructions (Signed)
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
## 2018-05-12 LAB — URINALYSIS, MICROSCOPIC ONLY: CASTS: NONE SEEN /LPF

## 2018-07-01 ENCOUNTER — Ambulatory Visit: Payer: BLUE CROSS/BLUE SHIELD | Admitting: Obstetrics and Gynecology

## 2018-07-01 ENCOUNTER — Other Ambulatory Visit: Payer: Self-pay

## 2018-07-01 ENCOUNTER — Encounter: Payer: Self-pay | Admitting: Obstetrics and Gynecology

## 2018-07-01 VITALS — BP 152/78 | HR 63 | Temp 98.3°F | Ht 65.5 in | Wt 249.0 lb

## 2018-07-01 DIAGNOSIS — R3915 Urgency of urination: Secondary | ICD-10-CM | POA: Diagnosis not present

## 2018-07-01 LAB — POCT URINALYSIS DIPSTICK
Bilirubin, UA: NEGATIVE
GLUCOSE UA: NEGATIVE
Ketones, UA: NEGATIVE
Protein, UA: NEGATIVE
Urobilinogen, UA: 0.2 E.U./dL
pH, UA: 6 (ref 5.0–8.0)

## 2018-07-01 MED ORDER — CEPHALEXIN 500 MG PO CAPS: ORAL_CAPSULE | ORAL | 0 refills | Status: DC

## 2018-07-01 MED ORDER — FLUCONAZOLE 150 MG PO TABS: 150.0000 mg | ORAL_TABLET | Freq: Once | ORAL | 0 refills | Status: AC

## 2018-07-01 NOTE — Patient Instructions (Signed)
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
## 2018-07-02 LAB — URINALYSIS, MICROSCOPIC ONLY: Casts: NONE SEEN /lpf

## 2018-07-03 LAB — URINE CULTURE

## 2018-12-16 ENCOUNTER — Ambulatory Visit: Payer: BLUE CROSS/BLUE SHIELD | Admitting: Certified Nurse Midwife

## 2018-12-16 ENCOUNTER — Encounter: Payer: Self-pay | Admitting: Certified Nurse Midwife

## 2018-12-16 ENCOUNTER — Other Ambulatory Visit: Payer: Self-pay

## 2018-12-16 VITALS — BP 120/82 | HR 70 | Temp 98.1°F | Resp 16 | Wt 241.0 lb

## 2018-12-16 DIAGNOSIS — N390 Urinary tract infection, site not specified: Secondary | ICD-10-CM | POA: Diagnosis not present

## 2018-12-16 DIAGNOSIS — Z659 Problem related to unspecified psychosocial circumstances: Secondary | ICD-10-CM | POA: Diagnosis not present

## 2018-12-16 MED ORDER — CEPHALEXIN 500 MG PO CAPS: ORAL_CAPSULE | ORAL | 0 refills | Status: DC

## 2018-12-16 NOTE — Patient Instructions (Signed)
Urinary Tract Infection, Adult A urinary tract infection (UTI) is an infection of any part of the urinary tract. The urinary tract includes:  The kidneys.  The ureters.  The bladder.  The urethra. These organs make, store, and get rid of pee (urine) in the body. What are the causes? This is caused by germs (bacteria) in your genital area. These germs grow and cause swelling (inflammation) of your urinary tract. What increases the risk? You are more likely to develop this condition if:  You have a small, thin tube (catheter) to drain pee.  You cannot control when you pee or poop (incontinence).  You are female, and: ? You use these methods to prevent pregnancy: ? A medicine that kills sperm (spermicide). ? A device that blocks sperm (diaphragm). ? You have low levels of a female hormone (estrogen). ? You are pregnant.  You have genes that add to your risk.  You are sexually active.  You take antibiotic medicines.  You have trouble peeing because of: ? A prostate that is bigger than normal, if you are female. ? A blockage in the part of your body that drains pee from the bladder (urethra). ? A kidney stone. ? A nerve condition that affects your bladder (neurogenic bladder). ? Not getting enough to drink. ? Not peeing often enough.  You have other conditions, such as: ? Diabetes. ? A weak disease-fighting system (immune system). ? Sickle cell disease. ? Gout. ? Injury of the spine. What are the signs or symptoms? Symptoms of this condition include:  Needing to pee right away (urgently).  Peeing often.  Peeing small amounts often.  Pain or burning when peeing.  Blood in the pee.  Pee that smells bad or not like normal.  Trouble peeing.  Pee that is cloudy.  Fluid coming from the vagina, if you are female.  Pain in the belly or lower back. Other symptoms include:  Throwing up (vomiting).  No urge to eat.  Feeling mixed up (confused).  Being tired  and grouchy (irritable).  A fever.  Watery poop (diarrhea). How is this treated? This condition may be treated with:  Antibiotic medicine.  Other medicines.  Drinking enough water. Follow these instructions at home:  Medicines  Take over-the-counter and prescription medicines only as told by your doctor.  If you were prescribed an antibiotic medicine, take it as told by your doctor. Do not stop taking it even if you start to feel better. General instructions  Make sure you: ? Pee until your bladder is empty. ? Do not hold pee for a long time. ? Empty your bladder after sex. ? Wipe from front to back after pooping if you are a female. Use each tissue one time when you wipe.  Drink enough fluid to keep your pee pale yellow.  Keep all follow-up visits as told by your doctor. This is important. Contact a doctor if:  You do not get better after 1-2 days.  Your symptoms go away and then come back. Get help right away if:  You have very bad back pain.  You have very bad pain in your lower belly.  You have a fever.  You are sick to your stomach (nauseous).  You are throwing up. Summary  A urinary tract infection (UTI) is an infection of any part of the urinary tract.  This condition is caused by germs in your genital area.  There are many risk factors for a UTI. These include having a small, thin   tube to drain pee and not being able to control when you pee or poop.  Treatment includes antibiotic medicines for germs.  Drink enough fluid to keep your pee pale yellow. This information is not intended to replace advice given to you by your health care provider. Make sure you discuss any questions you have with your health care provider. Document Released: 03/17/2008 Document Revised: 04/08/2018 Document Reviewed: 04/08/2018 Elsevier Interactive Patient Education  2019 [employer] Inc[employer]  

## 2018-12-16 NOTE — Progress Notes (Signed)
58 y.o. Married Caucasian female [id number] here with complaint of UTI, with onset  on 4 days.. Patient complaining of urinary frequency/urgency/ and pain with urination. Patient denies fever, or back pain. Has noted some chills last pm, nausea or back pain. No new personal products. Patient feels not related to sexual activity. Denies any vaginal symptoms. Menopausal with  vaginal dryness using coconut oil with some relief.  Patient drinking adequate water intake. 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 Social stress with mother who has dementia and some Bipolar, so now she is in independent living..  Review of Systems  Constitutional: Positive for chills.  HENT: Negative.   Eyes: Negative.   Respiratory: Negative.   Cardiovascular: Negative.   Gastrointestinal: Negative.   Genitourinary: Positive for frequency and urgency.       Back pain  Musculoskeletal: Negative.   Skin: Negative.   Neurological: Negative.   Endo/Heme/Allergies: Negative.   Psychiatric/Behavioral: Negative.     O: Healthy female WDWN Affect: Normal, orientation x 3 Skin : warm and dry CVAT: negative bilateral Abdomen: positive for suprapubic tenderness  Pelvic exam: External genital area: normal, no lesions Bladder,Urethra tender, Urethral meatus: tender, red, dryness noted Vagina: normal vaginal discharge, normal appearance , non tender. Uterus,Cervix, Adnexa absent, no fullness or tenderness    A: UTI Normal pelvic exam Unable dip urine due azo use Social stress with caring for mother  P: Reviewed findings of UTI and need for treatment. HG:DJMEQA 500 mg bid x 7 STM:HDQQI , culture Reviewed warning signs and symptoms of UTI and need to advise if occurring. Encouraged to limit soda, tea, and coffee and be sure to increase water intake. Discussed coconut oil for vaginal dryness and around urinary meatus to help with prevention of UTI. Questions addressed. Encouraged to  seek help from family in caring for mother and take time for self.   RV prn

## 2018-12-17 LAB — URINE CULTURE

## 2019-05-20 ENCOUNTER — Ambulatory Visit: Payer: BLUE CROSS/BLUE SHIELD | Admitting: Certified Nurse Midwife

## 2019-05-30 DIAGNOSIS — Z113 Encounter for screening for infections with a predominantly sexual mode of transmission: Secondary | ICD-10-CM | POA: Diagnosis not present

## 2019-05-30 DIAGNOSIS — Z0000 Encounter for general adult medical examination without abnormal findings: Secondary | ICD-10-CM | POA: Diagnosis not present

## 2019-05-30 DIAGNOSIS — Z1211 Encounter for screening for malignant neoplasm of colon: Secondary | ICD-10-CM | POA: Diagnosis not present

## 2019-05-30 DIAGNOSIS — E559 Vitamin D deficiency, unspecified: Secondary | ICD-10-CM | POA: Diagnosis not present

## 2019-05-30 LAB — HM HIV SCREENING LAB: HM HIV Screening: NEGATIVE

## 2019-05-31 ENCOUNTER — Other Ambulatory Visit: Payer: Self-pay | Admitting: Certified Nurse Midwife

## 2019-05-31 DIAGNOSIS — Z1231 Encounter for screening mammogram for malignant neoplasm of breast: Secondary | ICD-10-CM

## 2019-06-01 ENCOUNTER — Other Ambulatory Visit: Payer: Self-pay

## 2019-06-01 ENCOUNTER — Ambulatory Visit
Admission: RE | Admit: 2019-06-01 | Discharge: 2019-06-01 | Disposition: A | Discharge status: Home / Self Care | Payer: BLUE CROSS/BLUE SHIELD | Source: Ambulatory Visit | Attending: Certified Nurse Midwife | Admitting: Certified Nurse Midwife

## 2019-06-01 DIAGNOSIS — Z1231 Encounter for screening mammogram for malignant neoplasm of breast: Secondary | ICD-10-CM

## 2019-06-15 DIAGNOSIS — R195 Other fecal abnormalities: Secondary | ICD-10-CM | POA: Diagnosis not present

## 2019-06-15 DIAGNOSIS — K5900 Constipation, unspecified: Secondary | ICD-10-CM | POA: Diagnosis not present

## 2019-06-21 DIAGNOSIS — R195 Other fecal abnormalities: Secondary | ICD-10-CM | POA: Diagnosis not present

## 2019-06-21 LAB — HM COLONOSCOPY

## 2019-07-12 ENCOUNTER — Ambulatory Visit: Payer: Self-pay | Admitting: Gastroenterology
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
## 2019-11-15 DIAGNOSIS — M9902 Segmental and somatic dysfunction of thoracic region: Secondary | ICD-10-CM | POA: Diagnosis not present

## 2019-11-15 DIAGNOSIS — M62830 Muscle spasm of back: Secondary | ICD-10-CM | POA: Diagnosis not present

## 2019-11-15 DIAGNOSIS — M9903 Segmental and somatic dysfunction of lumbar region: Secondary | ICD-10-CM | POA: Diagnosis not present

## 2019-11-15 DIAGNOSIS — M9905 Segmental and somatic dysfunction of pelvic region: Secondary | ICD-10-CM | POA: Diagnosis not present

## 2019-11-16 DIAGNOSIS — M47816 Spondylosis without myelopathy or radiculopathy, lumbar region: Secondary | ICD-10-CM | POA: Diagnosis not present

## 2019-11-16 DIAGNOSIS — M419 Scoliosis, unspecified: Secondary | ICD-10-CM | POA: Diagnosis not present

## 2019-11-18 DIAGNOSIS — Z4651 Encounter for fitting and adjustment of gastric lap band: Secondary | ICD-10-CM | POA: Diagnosis not present

## 2019-12-08 DIAGNOSIS — M9903 Segmental and somatic dysfunction of lumbar region: Secondary | ICD-10-CM | POA: Diagnosis not present

## 2019-12-08 DIAGNOSIS — M62830 Muscle spasm of back: Secondary | ICD-10-CM | POA: Diagnosis not present

## 2019-12-08 DIAGNOSIS — M9902 Segmental and somatic dysfunction of thoracic region: Secondary | ICD-10-CM | POA: Diagnosis not present

## 2019-12-08 DIAGNOSIS — M9905 Segmental and somatic dysfunction of pelvic region: Secondary | ICD-10-CM | POA: Diagnosis not present

## 2019-12-13 DIAGNOSIS — M9903 Segmental and somatic dysfunction of lumbar region: Secondary | ICD-10-CM | POA: Diagnosis not present

## 2019-12-13 DIAGNOSIS — M9905 Segmental and somatic dysfunction of pelvic region: Secondary | ICD-10-CM | POA: Diagnosis not present

## 2019-12-13 DIAGNOSIS — M62830 Muscle spasm of back: Secondary | ICD-10-CM | POA: Diagnosis not present

## 2019-12-13 DIAGNOSIS — M9902 Segmental and somatic dysfunction of thoracic region: Secondary | ICD-10-CM | POA: Diagnosis not present

## 2019-12-28 DIAGNOSIS — M62830 Muscle spasm of back: Secondary | ICD-10-CM | POA: Diagnosis not present

## 2019-12-28 DIAGNOSIS — M9903 Segmental and somatic dysfunction of lumbar region: Secondary | ICD-10-CM | POA: Diagnosis not present

## 2019-12-28 DIAGNOSIS — M9902 Segmental and somatic dysfunction of thoracic region: Secondary | ICD-10-CM | POA: Diagnosis not present

## 2019-12-28 DIAGNOSIS — M9905 Segmental and somatic dysfunction of pelvic region: Secondary | ICD-10-CM | POA: Diagnosis not present

## 2020-01-02 ENCOUNTER — Encounter: Payer: Self-pay | Admitting: Certified Nurse Midwife

## 2020-01-04 DIAGNOSIS — M9905 Segmental and somatic dysfunction of pelvic region: Secondary | ICD-10-CM | POA: Diagnosis not present

## 2020-01-04 DIAGNOSIS — M9903 Segmental and somatic dysfunction of lumbar region: Secondary | ICD-10-CM | POA: Diagnosis not present

## 2020-01-04 DIAGNOSIS — M9902 Segmental and somatic dysfunction of thoracic region: Secondary | ICD-10-CM | POA: Diagnosis not present

## 2020-01-04 DIAGNOSIS — M62830 Muscle spasm of back: Secondary | ICD-10-CM | POA: Diagnosis not present

## 2020-01-18 DIAGNOSIS — M9902 Segmental and somatic dysfunction of thoracic region: Secondary | ICD-10-CM | POA: Diagnosis not present

## 2020-01-18 DIAGNOSIS — M9905 Segmental and somatic dysfunction of pelvic region: Secondary | ICD-10-CM | POA: Diagnosis not present

## 2020-01-18 DIAGNOSIS — M62830 Muscle spasm of back: Secondary | ICD-10-CM | POA: Diagnosis not present

## 2020-01-18 DIAGNOSIS — M9903 Segmental and somatic dysfunction of lumbar region: Secondary | ICD-10-CM | POA: Diagnosis not present

## 2020-01-25 DIAGNOSIS — M9902 Segmental and somatic dysfunction of thoracic region: Secondary | ICD-10-CM | POA: Diagnosis not present

## 2020-01-25 DIAGNOSIS — M9905 Segmental and somatic dysfunction of pelvic region: Secondary | ICD-10-CM | POA: Diagnosis not present

## 2020-01-25 DIAGNOSIS — M62830 Muscle spasm of back: Secondary | ICD-10-CM | POA: Diagnosis not present

## 2020-01-25 DIAGNOSIS — M9903 Segmental and somatic dysfunction of lumbar region: Secondary | ICD-10-CM | POA: Diagnosis not present

## 2020-02-08 DIAGNOSIS — M9905 Segmental and somatic dysfunction of pelvic region: Secondary | ICD-10-CM | POA: Diagnosis not present

## 2020-02-08 DIAGNOSIS — M9902 Segmental and somatic dysfunction of thoracic region: Secondary | ICD-10-CM | POA: Diagnosis not present

## 2020-02-08 DIAGNOSIS — M62830 Muscle spasm of back: Secondary | ICD-10-CM | POA: Diagnosis not present

## 2020-02-08 DIAGNOSIS — M9903 Segmental and somatic dysfunction of lumbar region: Secondary | ICD-10-CM | POA: Diagnosis not present

## 2020-02-15 DIAGNOSIS — Z0000 Encounter for general adult medical examination without abnormal findings: Secondary | ICD-10-CM | POA: Diagnosis not present

## 2020-02-15 DIAGNOSIS — R7989 Other specified abnormal findings of blood chemistry: Secondary | ICD-10-CM | POA: Diagnosis not present

## 2020-02-15 DIAGNOSIS — E785 Hyperlipidemia, unspecified: Secondary | ICD-10-CM | POA: Diagnosis not present

## 2020-02-22 DIAGNOSIS — M9903 Segmental and somatic dysfunction of lumbar region: Secondary | ICD-10-CM | POA: Diagnosis not present

## 2020-02-22 DIAGNOSIS — M9905 Segmental and somatic dysfunction of pelvic region: Secondary | ICD-10-CM | POA: Diagnosis not present

## 2020-02-22 DIAGNOSIS — M62830 Muscle spasm of back: Secondary | ICD-10-CM | POA: Diagnosis not present

## 2020-02-22 DIAGNOSIS — M9902 Segmental and somatic dysfunction of thoracic region: Secondary | ICD-10-CM | POA: Diagnosis not present

## 2020-02-27 DIAGNOSIS — M9905 Segmental and somatic dysfunction of pelvic region: Secondary | ICD-10-CM | POA: Diagnosis not present

## 2020-02-27 DIAGNOSIS — M9902 Segmental and somatic dysfunction of thoracic region: Secondary | ICD-10-CM | POA: Diagnosis not present

## 2020-02-27 DIAGNOSIS — M62830 Muscle spasm of back: Secondary | ICD-10-CM | POA: Diagnosis not present

## 2020-02-27 DIAGNOSIS — M9903 Segmental and somatic dysfunction of lumbar region: Secondary | ICD-10-CM | POA: Diagnosis not present

## 2020-03-13 DIAGNOSIS — M9903 Segmental and somatic dysfunction of lumbar region: Secondary | ICD-10-CM | POA: Diagnosis not present

## 2020-03-13 DIAGNOSIS — M62830 Muscle spasm of back: Secondary | ICD-10-CM | POA: Diagnosis not present

## 2020-03-13 DIAGNOSIS — M9905 Segmental and somatic dysfunction of pelvic region: Secondary | ICD-10-CM | POA: Diagnosis not present

## 2020-03-13 DIAGNOSIS — M9902 Segmental and somatic dysfunction of thoracic region: Secondary | ICD-10-CM | POA: Diagnosis not present

## 2020-03-23 DIAGNOSIS — M9903 Segmental and somatic dysfunction of lumbar region: Secondary | ICD-10-CM | POA: Diagnosis not present

## 2020-03-23 DIAGNOSIS — M62830 Muscle spasm of back: Secondary | ICD-10-CM | POA: Diagnosis not present

## 2020-03-23 DIAGNOSIS — M9905 Segmental and somatic dysfunction of pelvic region: Secondary | ICD-10-CM | POA: Diagnosis not present

## 2020-03-23 DIAGNOSIS — M9902 Segmental and somatic dysfunction of thoracic region: Secondary | ICD-10-CM | POA: Diagnosis not present

## 2020-03-23 DIAGNOSIS — Z4651 Encounter for fitting and adjustment of gastric lap band: Secondary | ICD-10-CM | POA: Diagnosis not present

## 2020-03-29 DIAGNOSIS — M419 Scoliosis, unspecified: Secondary | ICD-10-CM | POA: Diagnosis not present

## 2020-03-29 DIAGNOSIS — M546 Pain in thoracic spine: Secondary | ICD-10-CM | POA: Diagnosis not present

## 2020-03-29 DIAGNOSIS — M545 Low back pain: Secondary | ICD-10-CM | POA: Diagnosis not present

## 2020-04-05 DIAGNOSIS — M545 Low back pain: Secondary | ICD-10-CM | POA: Diagnosis not present

## 2020-04-05 DIAGNOSIS — M546 Pain in thoracic spine: Secondary | ICD-10-CM | POA: Diagnosis not present

## 2020-04-05 DIAGNOSIS — M419 Scoliosis, unspecified: Secondary | ICD-10-CM | POA: Diagnosis not present

## 2020-04-06 DIAGNOSIS — M9903 Segmental and somatic dysfunction of lumbar region: Secondary | ICD-10-CM | POA: Diagnosis not present

## 2020-04-06 DIAGNOSIS — M62830 Muscle spasm of back: Secondary | ICD-10-CM | POA: Diagnosis not present

## 2020-04-06 DIAGNOSIS — M9902 Segmental and somatic dysfunction of thoracic region: Secondary | ICD-10-CM | POA: Diagnosis not present

## 2020-04-06 DIAGNOSIS — M9905 Segmental and somatic dysfunction of pelvic region: Secondary | ICD-10-CM | POA: Diagnosis not present

## 2020-04-12 DIAGNOSIS — M419 Scoliosis, unspecified: Secondary | ICD-10-CM | POA: Diagnosis not present

## 2020-04-12 DIAGNOSIS — M545 Low back pain: Secondary | ICD-10-CM | POA: Diagnosis not present

## 2020-04-12 DIAGNOSIS — M546 Pain in thoracic spine: Secondary | ICD-10-CM | POA: Diagnosis not present

## 2020-04-17 DIAGNOSIS — M9903 Segmental and somatic dysfunction of lumbar region: Secondary | ICD-10-CM | POA: Diagnosis not present

## 2020-04-17 DIAGNOSIS — M9902 Segmental and somatic dysfunction of thoracic region: Secondary | ICD-10-CM | POA: Diagnosis not present

## 2020-04-17 DIAGNOSIS — M9905 Segmental and somatic dysfunction of pelvic region: Secondary | ICD-10-CM | POA: Diagnosis not present

## 2020-04-17 DIAGNOSIS — M62830 Muscle spasm of back: Secondary | ICD-10-CM | POA: Diagnosis not present

## 2020-04-27 DIAGNOSIS — Z9884 Bariatric surgery status: Secondary | ICD-10-CM | POA: Diagnosis not present

## 2020-05-01 DIAGNOSIS — M9903 Segmental and somatic dysfunction of lumbar region: Secondary | ICD-10-CM | POA: Diagnosis not present

## 2020-05-01 DIAGNOSIS — M9902 Segmental and somatic dysfunction of thoracic region: Secondary | ICD-10-CM | POA: Diagnosis not present

## 2020-05-01 DIAGNOSIS — M62830 Muscle spasm of back: Secondary | ICD-10-CM | POA: Diagnosis not present

## 2020-05-01 DIAGNOSIS — M9905 Segmental and somatic dysfunction of pelvic region: Secondary | ICD-10-CM | POA: Diagnosis not present

## 2020-05-02 DIAGNOSIS — M419 Scoliosis, unspecified: Secondary | ICD-10-CM | POA: Diagnosis not present

## 2020-05-02 DIAGNOSIS — M546 Pain in thoracic spine: Secondary | ICD-10-CM | POA: Diagnosis not present

## 2020-05-02 DIAGNOSIS — M545 Low back pain: Secondary | ICD-10-CM | POA: Diagnosis not present

## 2020-05-09 DIAGNOSIS — M546 Pain in thoracic spine: Secondary | ICD-10-CM | POA: Diagnosis not present

## 2020-05-09 DIAGNOSIS — M545 Low back pain: Secondary | ICD-10-CM | POA: Diagnosis not present

## 2020-05-09 DIAGNOSIS — M419 Scoliosis, unspecified: Secondary | ICD-10-CM | POA: Diagnosis not present

## 2020-05-11 DIAGNOSIS — M9902 Segmental and somatic dysfunction of thoracic region: Secondary | ICD-10-CM | POA: Diagnosis not present

## 2020-05-11 DIAGNOSIS — M9903 Segmental and somatic dysfunction of lumbar region: Secondary | ICD-10-CM | POA: Diagnosis not present

## 2020-05-11 DIAGNOSIS — M62830 Muscle spasm of back: Secondary | ICD-10-CM | POA: Diagnosis not present

## 2020-05-11 DIAGNOSIS — M9905 Segmental and somatic dysfunction of pelvic region: Secondary | ICD-10-CM | POA: Diagnosis not present

## 2020-05-17 DIAGNOSIS — M545 Low back pain: Secondary | ICD-10-CM | POA: Diagnosis not present

## 2020-05-17 DIAGNOSIS — M546 Pain in thoracic spine: Secondary | ICD-10-CM | POA: Diagnosis not present

## 2020-05-17 DIAGNOSIS — M419 Scoliosis, unspecified: Secondary | ICD-10-CM | POA: Diagnosis not present

## 2020-05-24 DIAGNOSIS — M419 Scoliosis, unspecified: Secondary | ICD-10-CM | POA: Diagnosis not present

## 2020-05-24 DIAGNOSIS — M546 Pain in thoracic spine: Secondary | ICD-10-CM | POA: Diagnosis not present

## 2020-05-24 DIAGNOSIS — M545 Low back pain: Secondary | ICD-10-CM | POA: Diagnosis not present

## 2020-05-31 DIAGNOSIS — M419 Scoliosis, unspecified: Secondary | ICD-10-CM | POA: Diagnosis not present

## 2020-05-31 DIAGNOSIS — M545 Low back pain: Secondary | ICD-10-CM | POA: Diagnosis not present

## 2020-05-31 DIAGNOSIS — M546 Pain in thoracic spine: Secondary | ICD-10-CM | POA: Diagnosis not present

## 2020-06-01 DIAGNOSIS — M9903 Segmental and somatic dysfunction of lumbar region: Secondary | ICD-10-CM | POA: Diagnosis not present

## 2020-06-01 DIAGNOSIS — M9902 Segmental and somatic dysfunction of thoracic region: Secondary | ICD-10-CM | POA: Diagnosis not present

## 2020-06-01 DIAGNOSIS — M62830 Muscle spasm of back: Secondary | ICD-10-CM | POA: Diagnosis not present

## 2020-06-01 DIAGNOSIS — M9905 Segmental and somatic dysfunction of pelvic region: Secondary | ICD-10-CM | POA: Diagnosis not present

## 2020-06-07 DIAGNOSIS — M546 Pain in thoracic spine: Secondary | ICD-10-CM | POA: Diagnosis not present

## 2020-06-07 DIAGNOSIS — M545 Low back pain: Secondary | ICD-10-CM | POA: Diagnosis not present

## 2020-06-07 DIAGNOSIS — M419 Scoliosis, unspecified: Secondary | ICD-10-CM | POA: Diagnosis not present

## 2020-06-14 DIAGNOSIS — M545 Low back pain: Secondary | ICD-10-CM | POA: Diagnosis not present

## 2020-06-14 DIAGNOSIS — M546 Pain in thoracic spine: Secondary | ICD-10-CM | POA: Diagnosis not present

## 2020-06-14 DIAGNOSIS — M419 Scoliosis, unspecified: Secondary | ICD-10-CM | POA: Diagnosis not present

## 2020-06-15 DIAGNOSIS — M9903 Segmental and somatic dysfunction of lumbar region: Secondary | ICD-10-CM | POA: Diagnosis not present

## 2020-06-15 DIAGNOSIS — M9905 Segmental and somatic dysfunction of pelvic region: Secondary | ICD-10-CM | POA: Diagnosis not present

## 2020-06-15 DIAGNOSIS — M9902 Segmental and somatic dysfunction of thoracic region: Secondary | ICD-10-CM | POA: Diagnosis not present

## 2020-06-15 DIAGNOSIS — M62830 Muscle spasm of back: Secondary | ICD-10-CM | POA: Diagnosis not present

## 2020-06-21 DIAGNOSIS — M545 Low back pain: Secondary | ICD-10-CM | POA: Diagnosis not present

## 2020-06-21 DIAGNOSIS — M546 Pain in thoracic spine: Secondary | ICD-10-CM | POA: Diagnosis not present

## 2020-06-21 DIAGNOSIS — M419 Scoliosis, unspecified: Secondary | ICD-10-CM | POA: Diagnosis not present

## 2020-06-26 ENCOUNTER — Other Ambulatory Visit: Payer: Self-pay

## 2020-06-26 ENCOUNTER — Ambulatory Visit: Payer: BC Managed Care – PPO | Admitting: Sports Medicine

## 2020-06-26 VITALS — BP 116/86 | Ht 66.0 in | Wt 220.0 lb

## 2020-06-26 DIAGNOSIS — M940 Chondrocostal junction syndrome [Tietze]: Secondary | ICD-10-CM

## 2020-06-26 DIAGNOSIS — M532X4 Spinal instabilities, thoracic region: Secondary | ICD-10-CM | POA: Diagnosis not present
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
## 2020-06-28 DIAGNOSIS — M419 Scoliosis, unspecified: Secondary | ICD-10-CM | POA: Diagnosis not present

## 2020-06-28 DIAGNOSIS — M546 Pain in thoracic spine: Secondary | ICD-10-CM | POA: Diagnosis not present

## 2020-06-28 DIAGNOSIS — M545 Low back pain: Secondary | ICD-10-CM | POA: Diagnosis not present

## 2020-06-29 DIAGNOSIS — M62830 Muscle spasm of back: Secondary | ICD-10-CM | POA: Diagnosis not present

## 2020-06-29 DIAGNOSIS — M9905 Segmental and somatic dysfunction of pelvic region: Secondary | ICD-10-CM | POA: Diagnosis not present

## 2020-06-29 DIAGNOSIS — M9903 Segmental and somatic dysfunction of lumbar region: Secondary | ICD-10-CM | POA: Diagnosis not present

## 2020-06-29 DIAGNOSIS — M9902 Segmental and somatic dysfunction of thoracic region: Secondary | ICD-10-CM | POA: Diagnosis not present

## 2020-07-05 DIAGNOSIS — M419 Scoliosis, unspecified: Secondary | ICD-10-CM | POA: Diagnosis not present

## 2020-07-05 DIAGNOSIS — M545 Low back pain: Secondary | ICD-10-CM | POA: Diagnosis not present

## 2020-07-05 DIAGNOSIS — M546 Pain in thoracic spine: Secondary | ICD-10-CM | POA: Diagnosis not present

## 2020-07-06 DIAGNOSIS — Z9884 Bariatric surgery status: Secondary | ICD-10-CM | POA: Diagnosis not present

## 2020-07-11 ENCOUNTER — Other Ambulatory Visit: Payer: Self-pay | Admitting: Family Medicine

## 2020-07-11 DIAGNOSIS — Z1231 Encounter for screening mammogram for malignant neoplasm of breast: Secondary | ICD-10-CM

## 2020-07-12 DIAGNOSIS — M419 Scoliosis, unspecified: Secondary | ICD-10-CM | POA: Diagnosis not present

## 2020-07-12 DIAGNOSIS — M545 Low back pain: Secondary | ICD-10-CM | POA: Diagnosis not present

## 2020-07-12 DIAGNOSIS — M546 Pain in thoracic spine: Secondary | ICD-10-CM | POA: Diagnosis not present

## 2020-07-20 DIAGNOSIS — M9903 Segmental and somatic dysfunction of lumbar region: Secondary | ICD-10-CM | POA: Diagnosis not present

## 2020-07-20 DIAGNOSIS — M9902 Segmental and somatic dysfunction of thoracic region: Secondary | ICD-10-CM | POA: Diagnosis not present

## 2020-07-20 DIAGNOSIS — M62830 Muscle spasm of back: Secondary | ICD-10-CM | POA: Diagnosis not present

## 2020-07-20 DIAGNOSIS — M9905 Segmental and somatic dysfunction of pelvic region: Secondary | ICD-10-CM | POA: Diagnosis not present

## 2020-07-24 ENCOUNTER — Ambulatory Visit: Payer: BC Managed Care – PPO | Admitting: Sports Medicine

## 2020-07-24 ENCOUNTER — Other Ambulatory Visit: Payer: Self-pay

## 2020-07-24 VITALS — BP 125/50 | Ht 66.0 in | Wt 215.0 lb

## 2020-07-24 DIAGNOSIS — M545 Low back pain, unspecified: Secondary | ICD-10-CM

## 2020-07-24 DIAGNOSIS — M25561 Pain in right knee: Secondary | ICD-10-CM

## 2020-07-24 DIAGNOSIS — M940 Chondrocostal junction syndrome [Tietze]: Secondary | ICD-10-CM | POA: Diagnosis not present

## 2020-07-24 NOTE — Progress Notes (Signed)
Office Visit Note   Patient: Amy Ritter           Date of Birth: 1961-03-17           MRN: [id number] Visit Date: 07/24/2020 Requested by: Katherina Mires, MD [place],  [place] [address] PCP: Katherina Mires, MD  Subjective: CC: Follow up Rib pain, bilateral knee pain, R SI Joint pain  HPI: 59yo F presenting to clinic to follow up on slipping rib syndrome. Patient states that her rib pain has improved significantly. She has been doing the prescribed exercises, and states that she hasn't had any further episodes of rib slipping. She wears her rib belt if she feels sore, and states that this is good at controlling her pain.  She has been able to return to pilates without recurrence, and is very happy with how she feels after her workouts.   She is also concerned about chronic knee and lower back pain. She states that both of her knees (R>L) have bothered her for several years. 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 She is curious to know if she may have arthritis in this area, and if there is anything she can do about it. Pain does not radiate. No bowel/bladder dysfunction. Pain is primarily noticeable after a long day at work, but also with prolonged walking, and cold weather.               ROS:   All other systems were reviewed and are negative.  Objective: Vital Signs: BP (!) 125/50   Ht 5\' 6"  (1.676 m)   Wt 215 lb (97.5 kg)   LMP 10/13/2004   BMI 34.70 kg/m   Physical Exam:  General:  Alert and oriented, in no acute distress. Pulm:  Breathing unlabored. Psy:  Normal mood, congruent affect. Skin:  No bruises, no rashes.   Knee Exam:  General: Normal gait Standing exam: Mild valgus deformity of the knee.   Seated Exam:  Significant patellar crepitus with knee flexion and extension bilaterally.  Palpation:  Tenderness at medial joint lines bilaterally (R>L). No significant pain over lateral joint lines. Bilateral pain with patellar facet palpation. No significant pain over patellar tendon.   Supine exam: No effusion, normal patellar mobility.   Ligamentous Exam:  No pain or laxity with anterior/posterior drawer.  No obvious Sag.  No pain or laxity with varus/valgus stress across the knee.   Meniscus:  McMurray with no pain or deep clicking.   5/5 strength with hip flexion, adduction/abduction, and knee flexion/extension as well as ankle dorsi/plantarflexion.   BACK EXAM:  Exaggerated lumbar lordosis, with left-sided rib hump appreciated.  Mild tenderness over bilateral lumbar paraspinal muscles (R>L). Pain is most focused over R SI Joint. No pain over L SI Joint.    Imaging: No results found.  Assessment & Plan: 59 year old female presenting to clinic today to follow-up on slipping rib syndrome, as well as with concerns of bilateral knee pain and right SI joint pain. Per her ribs, patient states her symptoms have significantly improved with previously prescribed isometric exercises.  Discussed that she should continue these exercises for the foreseeable future.  Given her duration of her rib symptoms, doubt they will go away entirely-however optimistic that her symptoms can be well improved with continued exercises.  For her knees,  examination as above which is consistent for underlying osteoarthritis.  Patient is curious to get x-rays to confirm this diagnosis, which is reasonable.  We will obtain bilateral knee x-rays today, and follow-up with these results.  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  Will obtain pelvic films to evaluate for SI joint inflammation.   Patient is agreeable with plan, with no further questions or concerns today.   Patient seen and evaluated with the sports medicine fellow.  I agree  with the above plan of care.  Continue with current treatment for her slipping rib syndrome.  X-rays of both knees and lumbar spine.  Phone follow-up with those results when available.

## 2020-07-26 ENCOUNTER — Ambulatory Visit
Admission: RE | Admit: 2020-07-26 | Discharge: 2020-07-26 | Disposition: A | Discharge status: Home / Self Care | Payer: BC Managed Care – PPO | Source: Ambulatory Visit | Attending: Sports Medicine | Admitting: Sports Medicine

## 2020-07-26 DIAGNOSIS — M25562 Pain in left knee: Secondary | ICD-10-CM | POA: Diagnosis not present

## 2020-07-26 DIAGNOSIS — M25561 Pain in right knee: Secondary | ICD-10-CM | POA: Diagnosis not present

## 2020-07-26 DIAGNOSIS — M545 Low back pain, unspecified: Secondary | ICD-10-CM

## 2020-08-02 DIAGNOSIS — M419 Scoliosis, unspecified: Secondary | ICD-10-CM | POA: Diagnosis not present

## 2020-08-02 DIAGNOSIS — M545 Low back pain, unspecified: Secondary | ICD-10-CM | POA: Diagnosis not present

## 2020-08-02 DIAGNOSIS — M546 Pain in thoracic spine: Secondary | ICD-10-CM | POA: Diagnosis not present

## 2020-08-03 DIAGNOSIS — M62830 Muscle spasm of back: Secondary | ICD-10-CM | POA: Diagnosis not present

## 2020-08-03 DIAGNOSIS — M9905 Segmental and somatic dysfunction of pelvic region: Secondary | ICD-10-CM | POA: Diagnosis not present

## 2020-08-03 DIAGNOSIS — M9903 Segmental and somatic dysfunction of lumbar region: Secondary | ICD-10-CM | POA: Diagnosis not present

## 2020-08-03 DIAGNOSIS — M9902 Segmental and somatic dysfunction of thoracic region: Secondary | ICD-10-CM | POA: Diagnosis not present

## 2020-08-07 ENCOUNTER — Telehealth: Payer: Self-pay

## 2020-08-07 NOTE — Telephone Encounter (Signed)
Per Dr. Micheline Chapman: x-rays of both of her knees look good. No significant arthritis. She does have some arthritis in her back. If symptoms are bothersome enough then he would start with recommending physical therapy. We could refer her to someone if she would like.   Per pt- she is doing PT Pilates currently so she'll continue that. Will contact us if she wants a PT referral in the future.

## 2020-08-09 DIAGNOSIS — L814 Other melanin hyperpigmentation: Secondary | ICD-10-CM | POA: Diagnosis not present

## 2020-08-09 DIAGNOSIS — M546 Pain in thoracic spine: Secondary | ICD-10-CM | POA: Diagnosis not present

## 2020-08-09 DIAGNOSIS — L578 Other skin changes due to chronic exposure to nonionizing radiation: Secondary | ICD-10-CM | POA: Diagnosis not present

## 2020-08-09 DIAGNOSIS — C44519 Basal cell carcinoma of skin of other part of trunk: Secondary | ICD-10-CM | POA: Diagnosis not present

## 2020-08-09 DIAGNOSIS — Z85828 Personal history of other malignant neoplasm of skin: Secondary | ICD-10-CM | POA: Diagnosis not present

## 2020-08-09 DIAGNOSIS — L570 Actinic keratosis: Secondary | ICD-10-CM | POA: Diagnosis not present

## 2020-08-09 DIAGNOSIS — M545 Low back pain, unspecified: Secondary | ICD-10-CM | POA: Diagnosis not present

## 2020-08-09 DIAGNOSIS — Z23 Encounter for immunization: Secondary | ICD-10-CM | POA: Diagnosis not present

## 2020-08-09 DIAGNOSIS — L821 Other seborrheic keratosis: Secondary | ICD-10-CM | POA: Diagnosis not present

## 2020-08-09 DIAGNOSIS — M419 Scoliosis, unspecified: Secondary | ICD-10-CM | POA: Diagnosis not present

## 2020-08-09 DIAGNOSIS — D485 Neoplasm of uncertain behavior of skin: Secondary | ICD-10-CM | POA: Diagnosis not present

## 2020-08-17 DIAGNOSIS — M62830 Muscle spasm of back: Secondary | ICD-10-CM | POA: Diagnosis not present

## 2020-08-17 DIAGNOSIS — M9902 Segmental and somatic dysfunction of thoracic region: Secondary | ICD-10-CM | POA: Diagnosis not present

## 2020-08-17 DIAGNOSIS — M9903 Segmental and somatic dysfunction of lumbar region: Secondary | ICD-10-CM | POA: Diagnosis not present

## 2020-08-17 DIAGNOSIS — M9905 Segmental and somatic dysfunction of pelvic region: Secondary | ICD-10-CM | POA: Diagnosis not present

## 2020-08-23 DIAGNOSIS — C44519 Basal cell carcinoma of skin of other part of trunk: Secondary | ICD-10-CM | POA: Diagnosis not present

## 2020-08-30 DIAGNOSIS — M546 Pain in thoracic spine: Secondary | ICD-10-CM | POA: Diagnosis not present

## 2020-08-30 DIAGNOSIS — M419 Scoliosis, unspecified: Secondary | ICD-10-CM | POA: Diagnosis not present

## 2020-08-30 DIAGNOSIS — M545 Low back pain, unspecified: Secondary | ICD-10-CM | POA: Diagnosis not present

## 2020-09-03 ENCOUNTER — Ambulatory Visit
Admission: RE | Admit: 2020-09-03 | Discharge: 2020-09-03 | Disposition: A | Discharge status: Home / Self Care | Payer: BC Managed Care – PPO | Source: Ambulatory Visit | Attending: Family Medicine | Admitting: Family Medicine

## 2020-09-03 ENCOUNTER — Other Ambulatory Visit: Payer: Self-pay

## 2020-09-03 DIAGNOSIS — Z1231 Encounter for screening mammogram for malignant neoplasm of breast: Secondary | ICD-10-CM | POA: Diagnosis not present

## 2020-09-13 DIAGNOSIS — M419 Scoliosis, unspecified: Secondary | ICD-10-CM | POA: Diagnosis not present

## 2020-09-13 DIAGNOSIS — M546 Pain in thoracic spine: Secondary | ICD-10-CM | POA: Diagnosis not present

## 2020-09-13 DIAGNOSIS — M545 Low back pain, unspecified: Secondary | ICD-10-CM | POA: Diagnosis not present

## 2020-09-20 DIAGNOSIS — M545 Low back pain, unspecified: Secondary | ICD-10-CM | POA: Diagnosis not present

## 2020-09-20 DIAGNOSIS — M546 Pain in thoracic spine: Secondary | ICD-10-CM | POA: Diagnosis not present

## 2020-09-20 DIAGNOSIS — M419 Scoliosis, unspecified: Secondary | ICD-10-CM | POA: Diagnosis not present

## 2020-09-21 DIAGNOSIS — Z09 Encounter for follow-up examination after completed treatment for conditions other than malignant neoplasm: Secondary | ICD-10-CM | POA: Diagnosis not present

## 2020-09-27 DIAGNOSIS — M419 Scoliosis, unspecified: Secondary | ICD-10-CM | POA: Diagnosis not present

## 2020-09-27 DIAGNOSIS — M546 Pain in thoracic spine: Secondary | ICD-10-CM | POA: Diagnosis not present

## 2020-09-27 DIAGNOSIS — M545 Low back pain, unspecified: Secondary | ICD-10-CM | POA: Diagnosis not present

## 2020-09-28 DIAGNOSIS — M62830 Muscle spasm of back: Secondary | ICD-10-CM | POA: Diagnosis not present

## 2020-09-28 DIAGNOSIS — M9902 Segmental and somatic dysfunction of thoracic region: Secondary | ICD-10-CM | POA: Diagnosis not present

## 2020-09-28 DIAGNOSIS — M9903 Segmental and somatic dysfunction of lumbar region: Secondary | ICD-10-CM | POA: Diagnosis not present

## 2020-09-28 DIAGNOSIS — M9905 Segmental and somatic dysfunction of pelvic region: Secondary | ICD-10-CM | POA: Diagnosis not present

## 2020-10-08 DIAGNOSIS — M62830 Muscle spasm of back: Secondary | ICD-10-CM | POA: Diagnosis not present

## 2020-10-08 DIAGNOSIS — M9902 Segmental and somatic dysfunction of thoracic region: Secondary | ICD-10-CM | POA: Diagnosis not present

## 2020-10-08 DIAGNOSIS — M9905 Segmental and somatic dysfunction of pelvic region: Secondary | ICD-10-CM | POA: Diagnosis not present

## 2020-10-08 DIAGNOSIS — M9903 Segmental and somatic dysfunction of lumbar region: Secondary | ICD-10-CM | POA: Diagnosis not present

## 2020-10-11 DIAGNOSIS — M419 Scoliosis, unspecified: Secondary | ICD-10-CM | POA: Diagnosis not present

## 2020-10-11 DIAGNOSIS — M545 Low back pain, unspecified: Secondary | ICD-10-CM | POA: Diagnosis not present

## 2020-10-11 DIAGNOSIS — M546 Pain in thoracic spine: Secondary | ICD-10-CM | POA: Diagnosis not present

## 2021-04-14 IMAGING — MG DIGITAL SCREENING BILAT W/ TOMO W/ CAD
8 series · 8 of 24 positions shown · non-contrast
Comparison: Previous exam(s).

CLINICAL DATA: Screening.

EXAM:
DIGITAL SCREENING BILATERAL MAMMOGRAM WITH TOMO AND CAD

[L MLO synth-2D]
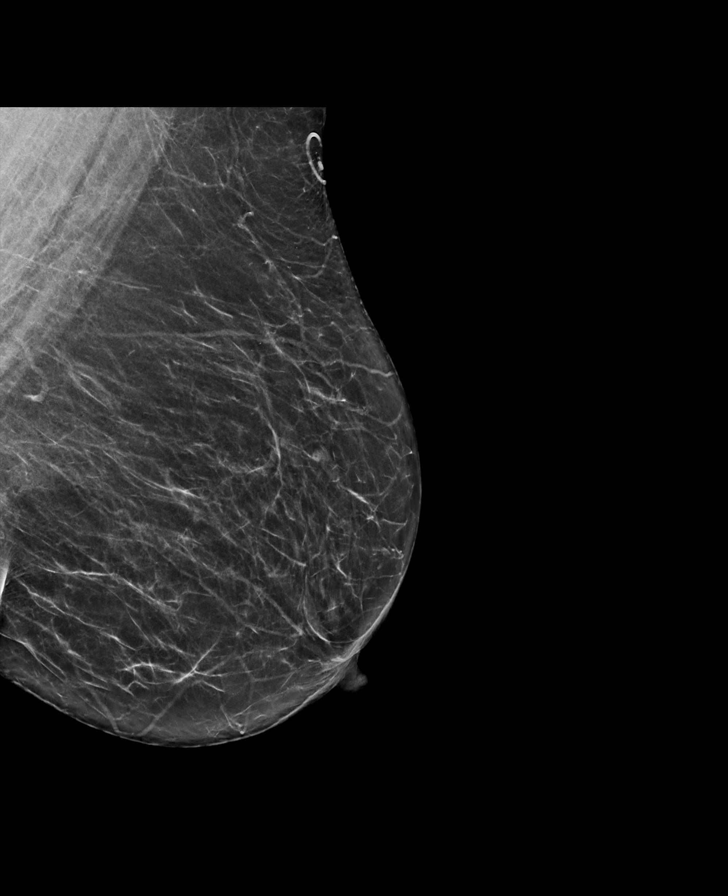

[L CC synth-2D]
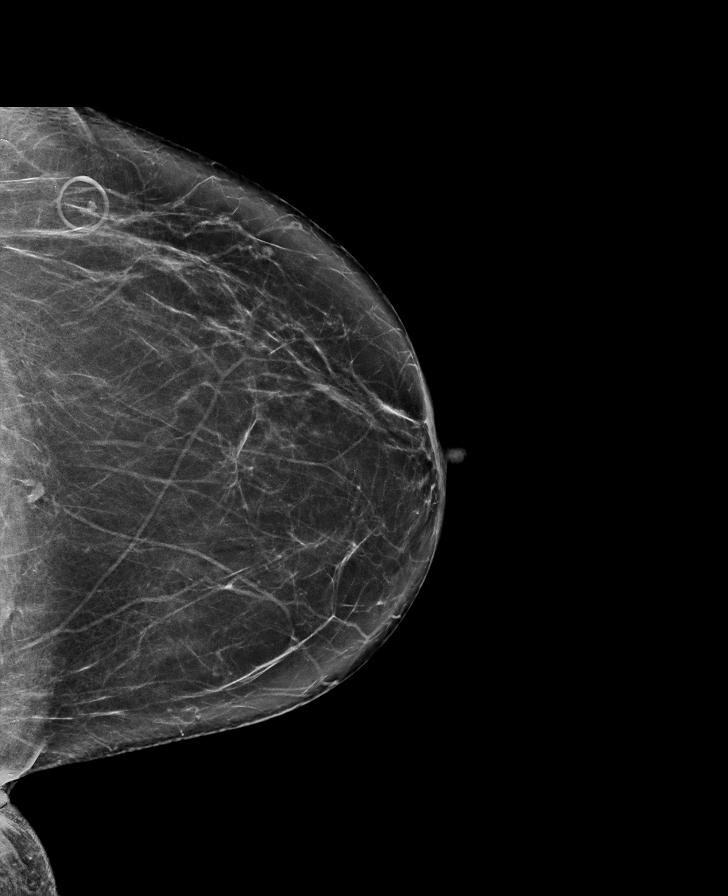

[R CC synth-2D]
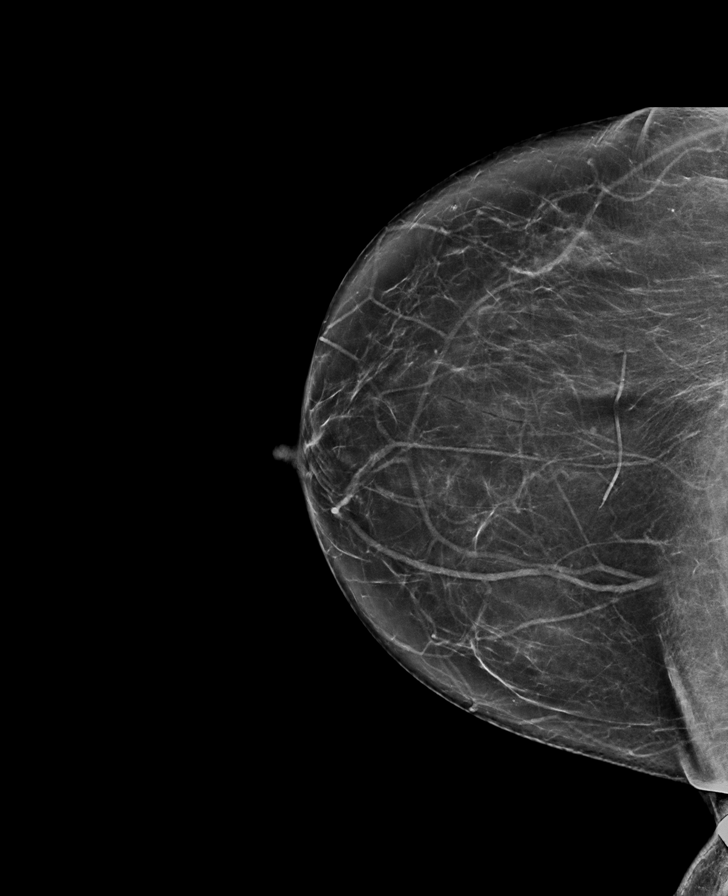

[R MLO synth-2D]
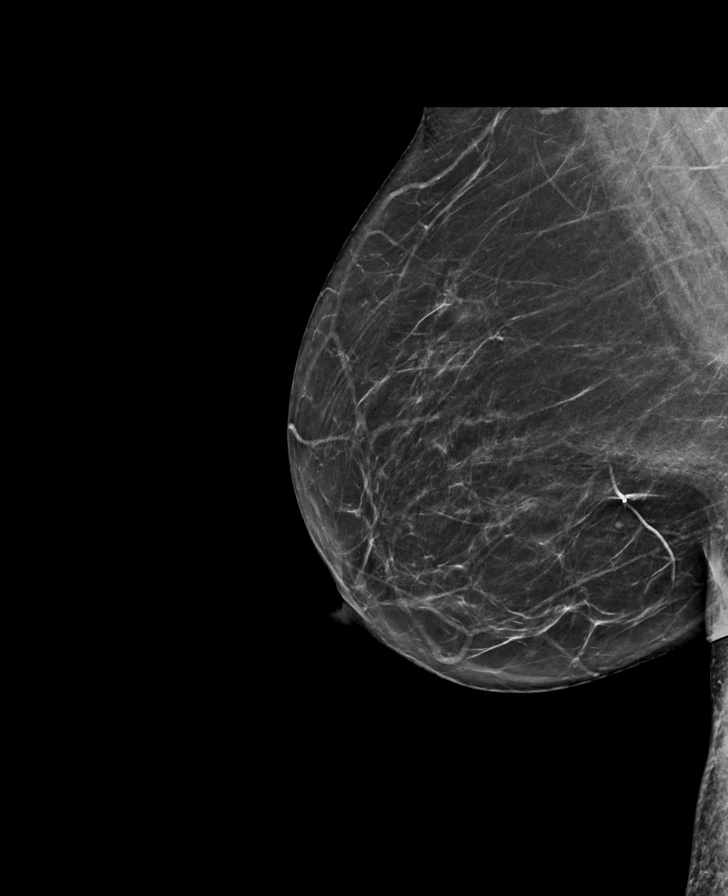

[L MLO tomo · tomo slice 35/70.0]
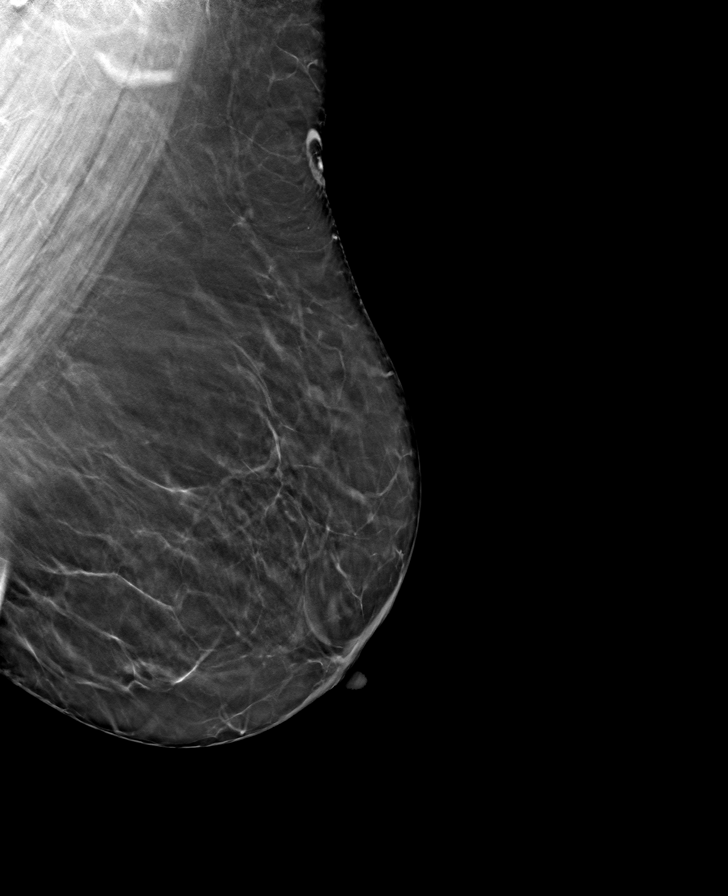

[L CC tomo · tomo slice 35/70.0]
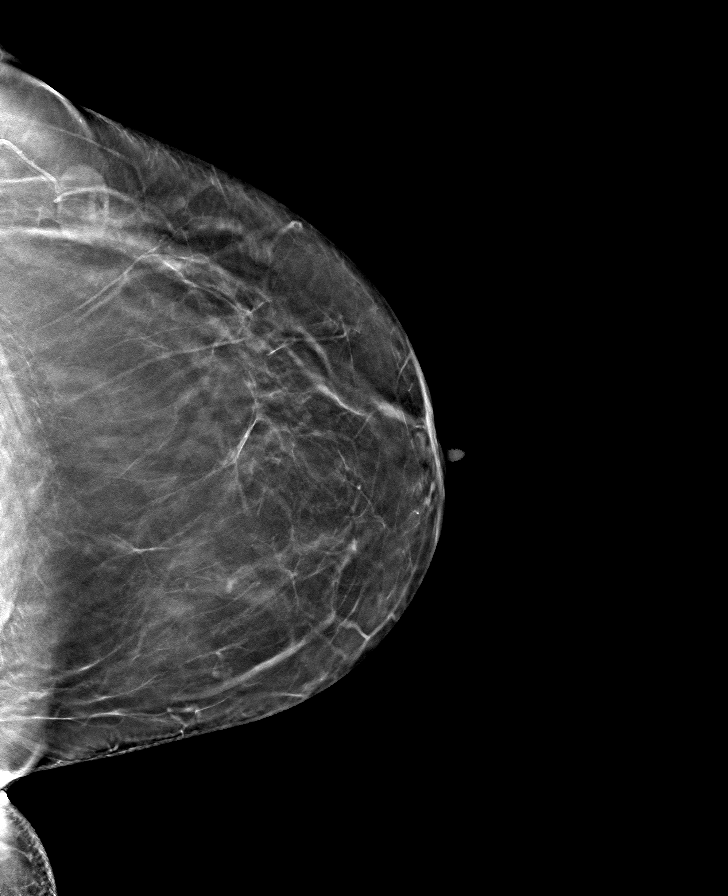

[R MLO tomo · tomo slice 37/72.0]
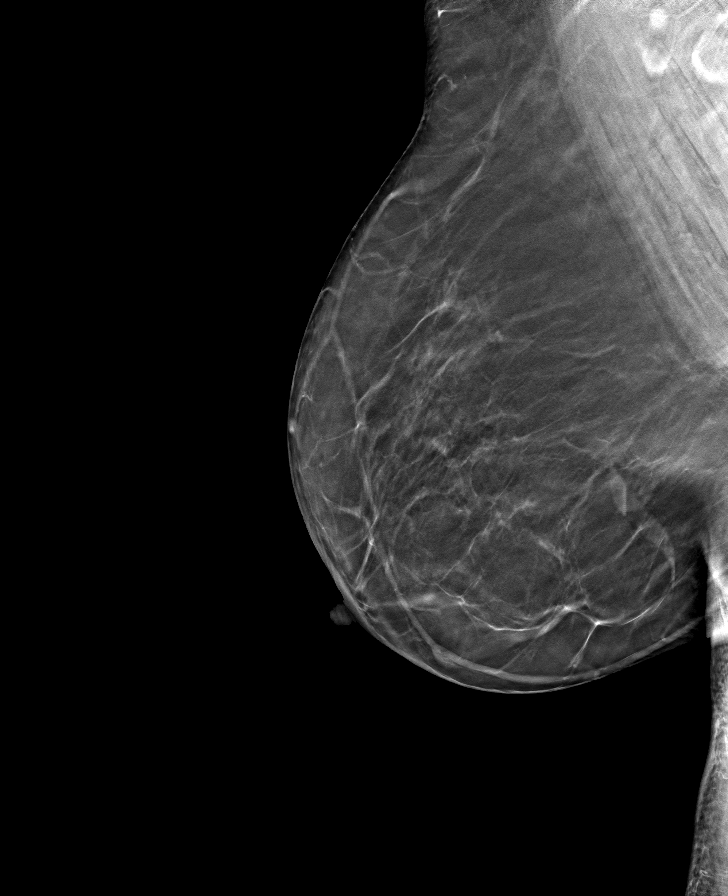

[R CC tomo · tomo slice 35/69.0]
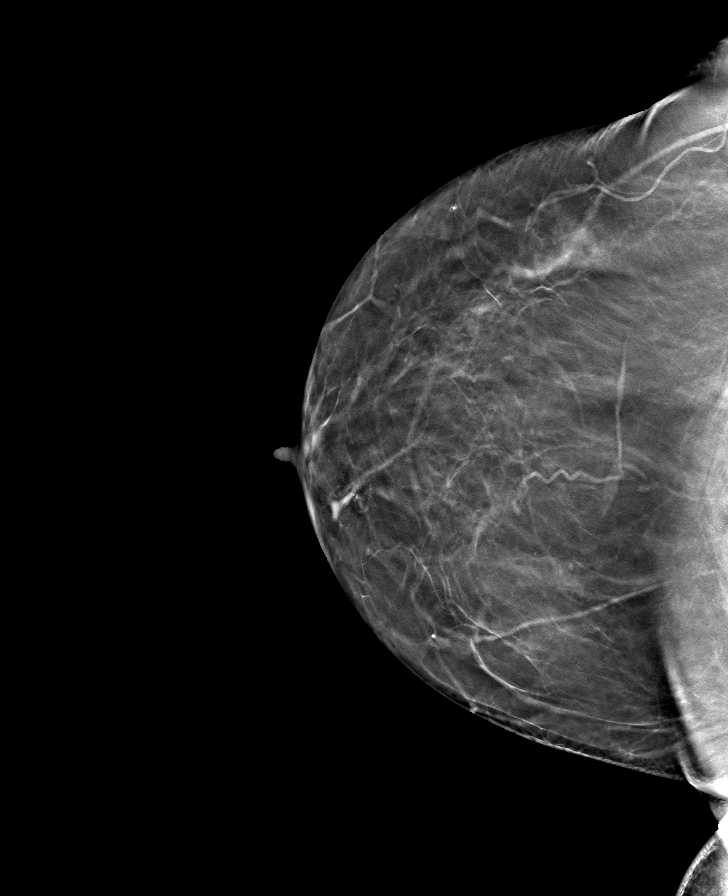

[8 of 24 positions shown; findings below may reference images not displayed]

ACR Breast Density Category b: There are scattered areas of
fibroglandular density.
FINDINGS: There are no findings suspicious for malignancy. Images were
processed with CAD.
IMPRESSION: No mammographic evidence of malignancy. A result letter of this
screening mammogram will be mailed directly to the patient.

RECOMMENDATION:
Screening mammogram in one year. (Code:[id number])

BI-RADS CATEGORY  1: Negative.

## 2021-07-10 LAB — HM COLONOSCOPY
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
## 2021-11-07 ENCOUNTER — Ambulatory Visit (INDEPENDENT_AMBULATORY_CARE_PROVIDER_SITE_OTHER): Payer: 59 | Admitting: Nurse Practitioner

## 2021-11-07 ENCOUNTER — Encounter: Payer: Self-pay | Admitting: Nurse Practitioner

## 2021-11-07 ENCOUNTER — Other Ambulatory Visit: Payer: Self-pay

## 2021-11-07 VITALS — BP 138/80 | HR 56 | Temp 97.8°F | Ht 65.0 in | Wt 209.4 lb

## 2021-11-07 DIAGNOSIS — E669 Obesity, unspecified: Secondary | ICD-10-CM

## 2021-11-07 DIAGNOSIS — E785 Hyperlipidemia, unspecified: Secondary | ICD-10-CM | POA: Insufficient documentation

## 2021-11-07 DIAGNOSIS — F411 Generalized anxiety disorder: Secondary | ICD-10-CM

## 2021-11-07 DIAGNOSIS — E559 Vitamin D deficiency, unspecified: Secondary | ICD-10-CM | POA: Diagnosis not present

## 2021-11-07 DIAGNOSIS — Z85828 Personal history of other malignant neoplasm of skin: Secondary | ICD-10-CM | POA: Insufficient documentation

## 2021-11-07 DIAGNOSIS — R1030 Lower abdominal pain, unspecified: Secondary | ICD-10-CM

## 2021-11-07 DIAGNOSIS — Z9884 Bariatric surgery status: Secondary | ICD-10-CM | POA: Diagnosis not present

## 2021-11-07 DIAGNOSIS — Z8589 Personal history of malignant neoplasm of other organs and systems: Secondary | ICD-10-CM

## 2021-11-07 DIAGNOSIS — R350 Frequency of micturition: Secondary | ICD-10-CM

## 2021-11-07 DIAGNOSIS — Z7689 Persons encountering health services in other specified circumstances: Secondary | ICD-10-CM

## 2021-11-07 DIAGNOSIS — R002 Palpitations: Secondary | ICD-10-CM

## 2021-11-07 LAB — HEMOGLOBIN A1C: Hgb A1c MFr Bld: 5.2 % (ref 4.6–6.5)

## 2021-11-07 LAB — POCT URINALYSIS DIPSTICK
Bilirubin, UA: NEGATIVE
Blood, UA: NEGATIVE
Glucose, UA: NEGATIVE
Nitrite, UA: POSITIVE
Protein, UA: POSITIVE — AB
Spec Grav, UA: 1.025 (ref 1.010–1.025)
Urobilinogen, UA: 1 E.U./dL
pH, UA: 5.5 (ref 5.0–8.0)

## 2021-11-07 LAB — CBC WITH DIFFERENTIAL/PLATELET
Basophils Absolute: 0 10*3/uL (ref 0.0–0.1)
Basophils Relative: 0.9 % (ref 0.0–3.0)
Eosinophils Absolute: 0.1 10*3/uL (ref 0.0–0.7)
Eosinophils Relative: 2.1 % (ref 0.0–5.0)
HCT: 42.6 % (ref 36.0–46.0)
Hemoglobin: 14.3 g/dL (ref 12.0–15.0)
Lymphocytes Relative: 33.5 % (ref 12.0–46.0)
Lymphs Abs: 1.4 10*3/uL (ref 0.7–4.0)
MCHC: 33.5 g/dL (ref 30.0–36.0)
MCV: 92.9 fl (ref 78.0–100.0)
Monocytes Absolute: 0.3 10*3/uL (ref 0.1–1.0)
Monocytes Relative: 6.6 % (ref 3.0–12.0)
Neutro Abs: 2.4 10*3/uL (ref 1.4–7.7)
Neutrophils Relative %: 56.9 % (ref 43.0–77.0)
Platelets: 264 10*3/uL (ref 150.0–400.0)
RBC: 4.59 Mil/uL (ref 3.87–5.11)
RDW: 12.5 % (ref 11.5–15.5)
WBC: 4.2 10*3/uL (ref 4.0–10.5)

## 2021-11-07 LAB — LIPID PANEL
Cholesterol: 208 mg/dL — ABNORMAL HIGH (ref 0–200)
HDL: 50.5 mg/dL (ref >39.00)
LDL Cholesterol: 134 mg/dL — ABNORMAL HIGH (ref 0–99)
NonHDL: 157.4
Total CHOL/HDL Ratio: 4
Triglycerides: 118 mg/dL (ref 0.0–149.0)
VLDL: 23.6 mg/dL (ref 0.0–40.0)

## 2021-11-07 LAB — COMPREHENSIVE METABOLIC PANEL
ALT: 12 U/L (ref 0–35)
AST: 14 U/L (ref 0–37)
Albumin: 4.1 g/dL (ref 3.5–5.2)
Alkaline Phosphatase: 61 U/L (ref 39–117)
BUN: 11 mg/dL (ref 6–23)
CO2: 27 mEq/L (ref 19–32)
Calcium: 9.4 mg/dL (ref 8.4–10.5)
Chloride: 106 mEq/L (ref 96–112)
Creatinine, Ser: 0.76 mg/dL (ref 0.40–1.20)
GFR: 84.8 mL/min (ref >60.00)
Glucose, Bld: 90 mg/dL (ref 70–99)
Potassium: 3.9 mEq/L (ref 3.5–5.1)
Sodium: 138 mEq/L (ref 135–145)
Total Bilirubin: 0.8 mg/dL (ref 0.2–1.2)
Total Protein: 6.9 g/dL (ref 6.0–8.3)

## 2021-11-07 LAB — TSH: TSH: 0.86 u[IU]/mL (ref 0.35–5.50)

## 2021-11-07 LAB — VITAMIN D 25 HYDROXY (VIT D DEFICIENCY, FRACTURES): VITD: 52.32 ng/mL (ref 30.00–100.00)

## 2021-11-07 MED ORDER — CEPHALEXIN 500 MG PO CAPS: 500.0000 mg | ORAL_CAPSULE | Freq: Two times a day (BID) | ORAL | 0 refills | Status: DC

## 2021-11-07 NOTE — Assessment & Plan Note (Signed)
BMI 34.8 today. Has a history of lap band which is still in place. States she has lost some weight a few months ago when she had covid-19. Will place referral back to bariatric surgery.

## 2021-11-07 NOTE — Assessment & Plan Note (Signed)
Chronic, stable. Well controlled without medications. Continue lifestyle changes. Follow up with any concerns.

## 2021-11-07 NOTE — Assessment & Plan Note (Signed)
Check vitamin D levels and adjust regimen based on results.

## 2021-11-07 NOTE — Assessment & Plan Note (Signed)
History of squamous and basal cell carcinoma. States that when she went last, she had 2 areas frozen. She would like a referral back to a different dermatologist. Referral placed today.

## 2021-11-07 NOTE — Assessment & Plan Note (Signed)
History of lap band with [facility] and was following with Dr. Hassell Done. She has had difficulty maintaining weight loss, although she has lost some weight when she had Covid-19 recently. Will place referral back to [facility] for routine follow-up.

## 2021-11-07 NOTE — Assessment & Plan Note (Signed)
Lower abdominal/pelvic pain. She has a history of hysterectomy, however she does still have her ovaries. Pain could be related to UTI, however she states this has been going on and off for the past several months. She also had a colonoscopy in 2020 which showed diverticulosis. Will check pelvic ultrasound and treat for UTI. Follow up if ongoing symptoms.

## 2021-11-07 NOTE — Assessment & Plan Note (Signed)
Chronic, ongoing. She states that they worsened when she had Covid-19. She has had a normal holter monitor and stress echo in 2012. Will place referral to cardiology. Encourage limiting caffeine.

## 2021-11-07 NOTE — Assessment & Plan Note (Signed)
History of elevated LDL. Check lipid panel today and treat based on results.

## 2021-11-07 NOTE — Patient Instructions (Signed)
It was great to see you!  I have placed a referral to cardiology, dermatology, and [place] surgery as discussed  We are checking your blood work and urine and will call if abnormal or send through mychart  I have ordered a pelvic ultrasound and you will be called to schedule this.   Let's follow-up in 6 months, sooner if you have concerns.  If a referral was placed today, you will be contacted for an appointment. Please note that routine referrals can sometimes take up to 3-4 weeks to process. Please call our office if you haven't heard anything after this time frame.  Take care,  Vance Peper, NP

## 2021-11-09 LAB — URINE CULTURE
MICRO NUMBER:: 12924220
SPECIMEN QUALITY:: ADEQUATE

## 2021-11-21 ENCOUNTER — Other Ambulatory Visit: Payer: Self-pay

## 2021-11-21 ENCOUNTER — Ambulatory Visit: Payer: 59

## 2021-12-05 ENCOUNTER — Ambulatory Visit: Payer: 59

## 2021-12-05 ENCOUNTER — Encounter: Payer: Self-pay | Admitting: Cardiology

## 2021-12-05 ENCOUNTER — Ambulatory Visit: Payer: 59 | Admitting: Cardiology

## 2021-12-05 ENCOUNTER — Other Ambulatory Visit: Payer: Self-pay

## 2021-12-05 VITALS — BP 140/70 | HR 53 | Ht 65.0 in | Wt 210.0 lb

## 2021-12-05 DIAGNOSIS — R002 Palpitations: Secondary | ICD-10-CM

## 2021-12-05 NOTE — Progress Notes (Unsigned)
Enrolled for [employer] to mail a ZIO XT long term holter monitor to the patients address on file.  
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
## 2021-12-05 NOTE — Assessment & Plan Note (Signed)
Has had palpitations for quite some time she states.  The worrisome finding is that her Fitbit has categorized some of her palpitations as atrial fibrillation.  Of course if atrial fibrillation is present, this would change her management, i.e. anticoagulation.  We will go ahead and place a ZIO monitor for further evaluation.  I did explain to her that sometimes the algorithms on Fitbit etc. are not as accurate.  Nonetheless, she is not having any high risk symptoms such as syncope.  She does occasionally have these skipping sensations and left-sided jaw pain but no chest discomfort.  No murmurs heard on exam.  We will follow-up with results of study.

## 2021-12-05 NOTE — Patient Instructions (Signed)
Medication Instructions:  The current medical regimen is effective;  continue present plan and medications.  *If you need a refill on your cardiac medications before your next appointment, please call your pharmacy*  Testing/Procedures: [place] Monitor Instructions  Your physician has requested you wear a ZIO patch monitor for 14 days.  This is a single patch monitor. [employer] supplies one patch monitor per enrollment. Additional stickers are not available. Please do not apply patch if you will be having a Nuclear Stress Test,  Echocardiogram, Cardiac CT, MRI, or Chest Xray during the period you would be wearing the  monitor. The patch cannot be worn during these tests. You cannot remove and re-apply the  ZIO XT patch monitor.  Your ZIO patch monitor will be mailed 3 day USPS to your address on file. It may take 3-5 days  to receive your monitor after you have been enrolled.  Once you have received your monitor, please review the enclosed instructions. Your monitor  has already been registered assigning a specific monitor serial # to you.  Billing and Patient Assistance Program Information  We have supplied [employer] with any of your insurance information on file for billing purposes. [employer] offers a sliding scale Patient Assistance Program for patients that do not have  insurance, or whose insurance does not completely cover the cost of the ZIO monitor.  You must apply for the Patient Assistance Program to qualify for this discounted rate.  To apply, please call [employer] at [phone], select option 4, select option 2, ask to apply for  Patient Assistance Program. Theodore Demark will ask your household income, and how many people  are in your household. They will quote your out-of-pocket cost based on that information.  [employer] will also be able to set up a 78-month, interest-free payment plan if needed.  Applying the monitor   Shave hair from upper left chest.  Hold abrader disc  by orange tab. Rub abrader in 40 strokes over the upper left chest as  indicated in your monitor instructions.  Clean area with 4 enclosed alcohol pads. Let dry.  Apply patch as indicated in monitor instructions. Patch will be placed under collarbone on left  side of chest with arrow pointing upward.  Rub patch adhesive wings for 2 minutes. Remove white label marked "1". Remove the white  label marked "2". Rub patch adhesive wings for 2 additional minutes.  While looking in a mirror, press and release button in center of patch. A small green light will  flash 3-4 times. This will be your only indicator that the monitor has been turned on.  Do not shower for the first 24 hours. You may shower after the first 24 hours.  Press the button if you feel a symptom. You will hear a small click. Record Date, Time and  Symptom in the Patient Logbook.  When you are ready to remove the patch, follow instructions on the last 2 pages of Patient  Logbook. Stick patch monitor onto the last page of Patient Logbook.  Place Patient Logbook in the blue and white box. Use locking tab on box and tape box closed  securely. The blue and white box has prepaid postage on it. Please place it in the mailbox as  soon as possible. Your physician should have your test results approximately 7 days after the  monitor has been mailed back to [facility].  Call [facility] at [phone] if you have questions regarding  your ZIO XT patch  monitor. Call them immediately if you see an orange light blinking on your  monitor.  If your monitor falls off in less than 4 days, contact our Monitor department at [phone].  If your monitor becomes loose or falls off after 4 days call [employer] at [phone] for  suggestions on securing your monitor  Follow-Up: At [facility], you and your health needs are our priority.  As part of our continuing mission to provide you with exceptional heart care, we have  created designated Provider Care Teams.  These Care Teams include your primary Cardiologist (physician) and Advanced Practice Providers (APPs -  Physician Assistants and Nurse Practitioners) who all work together to provide you with the care you need, when you need it.  We recommend signing up for the patient portal called "MyChart".  Sign up information is provided on this After Visit Summary.  MyChart is used to connect with patients for Virtual Visits (Telemedicine).  Patients are able to view lab/test results, encounter notes, upcoming appointments, etc.  Non-urgent messages can be sent to your provider as well.   To learn more about what you can do with MyChart, go to [email or web address].    Your next appointment:   Follow up will be based on the results of the above testing.  Thank you for choosing [facility]!!

## 2022-01-09 LAB — HM MAMMOGRAPHY

## 2022-04-18 DIAGNOSIS — Z131 Encounter for screening for diabetes mellitus: Secondary | ICD-10-CM | POA: Diagnosis not present

## 2022-04-18 DIAGNOSIS — Z6833 Body mass index (BMI) 33.0-33.9, adult: Secondary | ICD-10-CM | POA: Diagnosis not present

## 2022-04-24 ENCOUNTER — Ambulatory Visit: Payer: 59 | Admitting: Nurse Practitioner

## 2022-05-15 ENCOUNTER — Encounter: Payer: Self-pay | Admitting: Nurse Practitioner

## 2022-05-15 ENCOUNTER — Ambulatory Visit (INDEPENDENT_AMBULATORY_CARE_PROVIDER_SITE_OTHER): Payer: 59 | Admitting: Nurse Practitioner

## 2022-05-15 VITALS — BP 130/84 | HR 56 | Temp 96.4°F | Wt 217.4 lb

## 2022-05-15 DIAGNOSIS — R1030 Lower abdominal pain, unspecified: Secondary | ICD-10-CM

## 2022-05-15 DIAGNOSIS — R350 Frequency of micturition: Secondary | ICD-10-CM | POA: Diagnosis not present

## 2022-05-15 DIAGNOSIS — R002 Palpitations: Secondary | ICD-10-CM | POA: Diagnosis not present

## 2022-05-15 LAB — BASIC METABOLIC PANEL
BUN: 10 mg/dL (ref 6–23)
CO2: 28 mEq/L (ref 19–32)
Calcium: 9 mg/dL (ref 8.4–10.5)
Chloride: 105 mEq/L (ref 96–112)
Creatinine, Ser: 0.81 mg/dL (ref 0.40–1.20)
GFR: 78.27 mL/min (ref >60.00)
Glucose, Bld: 95 mg/dL (ref 70–99)
Potassium: 4.3 mEq/L (ref 3.5–5.1)
Sodium: 139 mEq/L (ref 135–145)

## 2022-05-15 LAB — POCT URINALYSIS DIPSTICK
Bilirubin, UA: NEGATIVE
Blood, UA: NEGATIVE
Glucose, UA: NEGATIVE
Ketones, UA: NEGATIVE
Leukocytes, UA: NEGATIVE
Nitrite, UA: NEGATIVE
Protein, UA: NEGATIVE
Spec Grav, UA: 1.025 (ref 1.010–1.025)
Urobilinogen, UA: 0.2 E.U./dL
pH, UA: 6 (ref 5.0–8.0)

## 2022-05-15 LAB — CBC
HCT: 40.4 % (ref 36.0–46.0)
Hemoglobin: 13.6 g/dL (ref 12.0–15.0)
MCHC: 33.6 g/dL (ref 30.0–36.0)
MCV: 93.3 fl (ref 78.0–100.0)
Platelets: 266 10*3/uL (ref 150.0–400.0)
RBC: 4.33 Mil/uL (ref 3.87–5.11)
RDW: 12.3 % (ref 11.5–15.5)
WBC: 5.1 10*3/uL (ref 4.0–10.5)

## 2022-05-15 LAB — TSH: TSH: 0.94 u[IU]/mL (ref 0.35–5.50)

## 2022-05-15 LAB — MAGNESIUM: Magnesium: 1.9 mg/dL (ref 1.5–2.5)

## 2022-05-15 NOTE — Assessment & Plan Note (Signed)
She is having ongoing palpitations.  She states that her Fitbit was telling her that she had A-fib and her heart rate will go from 50-140.  She went to cardiology and wore a Holter monitor, however it appears is only 3 days of data.  She would like a referral for a second opinion from a different cardiologist.  With ongoing symptoms agree with this, referral placed.  We will check BMP, magnesium, TSH, CBC.  She is currently also getting infusions of electrolytes at a holistic clinic.  Follow-up in 3 months, sooner with concerns.

## 2022-05-15 NOTE — Progress Notes (Signed)
Established Patient Office Visit  Subjective   Patient ID: Amy Ritter, female    DOB: 09-Nov-1960  Age: 61 y.o. MRN: [id number]  Chief Complaint  Patient presents with   Follow-up    6 mo f/u. Pt states she still has elevated and irregular HR w/ jaw pain last 2 days. Discuss previous referrals.     HPI  Amy Ritter is here to follow-up on chronic disease management.   She is still having ongoing intermittent palpitations. She started getting infusions of magnesium and potassium with [facility] infusion. She states she did a wellness infusion which is helping with her palpitations. She states that Tuesday night she had an episode of palpitations, jitteriness, and jaw pain. She can normally tell when her heart rate goes up due to jaw pain first. She states her fit bit tells her she is in a-fib and her heart rate will go from 50 to 140. These episodes tend to happen once a week. With the wellness infusions, she has only been having episodes every 2-3 weeks. She went to cardiology and had an event monitor done, but it only captured 3 days of data. She is interested in seeing a different cardiologist for a second opinion.  She has been having ongoing pelvic/lower abdominal pain that comes and goes. She also endorses frequent urination. She denies dysuria. She also has been waking up 3-4 times at night to urinate. She went to a [facility] and was checked for diabetes, and her A1c was 5.1%. She was not able to get her ultrasound of her pelvis due to insurance issues. She denies nausea, vomiting, constipation, and diarrhea. She does have a history of diverticulosis and is wondering if that is causing her pain.     ROS See pertinent positives and negatives per HPI.   Objective:     BP 130/84 (BP Location: Left Arm, Cuff Size: Large)   Pulse (!) 56   Temp (!) 96.4 F (35.8 C) (Temporal)   Wt 217 lb 6.4 oz (98.6 kg)   LMP 10/13/2004   SpO2 99%   BMI 36.18 kg/m    Physical  Exam Vitals and nursing note reviewed.  Constitutional:      General: She is not in acute distress.    Appearance: Normal appearance.  HENT:     Head: Normocephalic.     Right Ear: Tympanic membrane, ear canal and external ear normal.     Left Ear: Tympanic membrane, ear canal and external ear normal.  Eyes:     Conjunctiva/sclera: Conjunctivae normal.  Cardiovascular:     Rate and Rhythm: Normal rate and regular rhythm.     Pulses: Normal pulses.     Heart sounds: Normal heart sounds.  Pulmonary:     Effort: Pulmonary effort is normal.     Breath sounds: Normal breath sounds.  Abdominal:     Palpations: Abdomen is soft.     Tenderness: There is no abdominal tenderness.  Musculoskeletal:     Cervical back: Normal range of motion and neck supple. No tenderness.  Lymphadenopathy:     Cervical: No cervical adenopathy.  Skin:    General: Skin is warm.  Neurological:     General: No focal deficit present.     Mental Status: She is alert and oriented to person, place, and time.  Psychiatric:        Mood and Affect: Mood normal.        Behavior: Behavior normal.  Thought Content: Thought content normal.        Judgment: Judgment normal.     Results for orders placed or performed in visit on 05/15/22  POCT urinalysis dipstick  Result Value Ref Range   Color, UA YELLOW    Clarity, UA CLEAR    Glucose, UA Negative Negative   Bilirubin, UA NEGATIVE    Ketones, UA NEGATIVE    Spec Grav, UA 1.025 1.010 - 1.025   Blood, UA NEGATIVE    pH, UA 6.0 5.0 - 8.0   Protein, UA Negative Negative   Urobilinogen, UA 0.2 0.2 or 1.0 E.U./dL   Nitrite, UA NEGATIVE    Leukocytes, UA Negative Negative   Appearance     Odor        The 10-year ASCVD risk score (Arnett DK, et al., 2019) is: 4.1%    Assessment & Plan:   Problem List Items Addressed This Visit       Other   Palpitations - Primary    She is having ongoing palpitations.  She states that her Fitbit was telling  her that she had A-fib and her heart rate will go from 50-140.  She went to cardiology and wore a Holter monitor, however it appears is only 3 days of data.  She would like a referral for a second opinion from a different cardiologist.  With ongoing symptoms agree with this, referral placed.  We will check BMP, magnesium, TSH, CBC.  She is currently also getting infusions of electrolytes at a holistic clinic.  Follow-up in 3 months, sooner with concerns.      Relevant Orders   Basic metabolic panel   CBC   Magnesium   TSH   Ambulatory referral to Cardiology   Lower abdominal pain    Chronic, intermittent.  She states that the pain stays in her left lower quadrant, no red flags on exam.  She had an ultrasound done at a different office and was told that she has some laxity in her right pelvic floor muscles.  She is going to start doing physical therapy for this.  Last CBC was normal.  Discussed limited seeds, nuts, popcorn that can sometimes cause pain with diverticulosis.  Follow-up in 3 months, sooner if symptoms worsen.      Relevant Orders   Basic metabolic panel   CBC   Other Visit Diagnoses     Urinary frequency       UA negative, [facility]  We will check BMP   Relevant Orders   POCT urinalysis dipstick (Completed)       Return in about 3 months (around 08/15/2022) for palpitations.    Charyl Dancer, NP

## 2022-05-15 NOTE — Assessment & Plan Note (Signed)
Chronic, intermittent.  She states that the pain stays in her left lower quadrant, no red flags on exam.  She had an ultrasound done at a different office and was told that she has some laxity in her right pelvic floor muscles.  She is going to start doing physical therapy for this.  Last CBC was normal.  Discussed limited seeds, nuts, popcorn that can sometimes cause pain with diverticulosis.  Follow-up in 3 months, sooner if symptoms worsen.

## 2022-05-15 NOTE — Patient Instructions (Signed)
It was great to see you!  We are checking your labs today and will let you know the results via mychart/phone.   I have placed a referral to cardiology for a second opinion.  Let's follow-up in 3 months, sooner if you have concerns.  If a referral was placed today, you will be contacted for an appointment. Please note that routine referrals can sometimes take up to 3-4 weeks to process. Please call our office if you haven't heard anything after this time frame.  Take care,  Vance Peper, NP

## 2022-05-20 ENCOUNTER — Telehealth: Payer: Self-pay | Admitting: Cardiology

## 2022-05-20 ENCOUNTER — Encounter: Payer: Self-pay | Admitting: *Deleted

## 2022-05-20 NOTE — Telephone Encounter (Signed)
Pt would like to do a Provider Switch to a female Provider  From: Amy Ritter  To: [place] at [facility]

## 2022-05-20 NOTE — Telephone Encounter (Signed)
Spoke to [place] previous Monitor was lost and mark lost in the system.  She is going to send another monitor to patient.  Called pt left message that she should rec a new Monitor in about  days.

## 2022-05-20 NOTE — Progress Notes (Signed)
Patient ID: Amy Ritter, female   DOB: 08/22/1961, 61 y.o.   MRN: [id number] Patient had ZIO XT monitor ordered in 11/2021. Patient states she wore the monitor and mailed it back to [facility] for processing. No tracking information was available for returned monitor on [employer].  Monitor status was marked as lost.  Patient requests replacement. [employer] notified to cancel any charges on [phone] and to please send patient a replacement.

## 2022-05-21 ENCOUNTER — Ambulatory Visit (INDEPENDENT_AMBULATORY_CARE_PROVIDER_SITE_OTHER): Payer: 59

## 2022-05-21 ENCOUNTER — Other Ambulatory Visit: Payer: Self-pay | Admitting: *Deleted

## 2022-05-21 DIAGNOSIS — R002 Palpitations: Secondary | ICD-10-CM

## 2022-05-21 NOTE — Progress Notes (Unsigned)
First ZIO XT monitor from 12/06/21 enrollment lost in return mail.  Order and charges cancelled. [employer] requested new order to mail out replacement. Enrolled for [employer] to mail a ZIO XT long term holter monitor to the patients address on file.

## 2022-05-22 ENCOUNTER — Ambulatory Visit: Payer: 59 | Admitting: Nurse Practitioner

## 2022-05-24 DIAGNOSIS — R002 Palpitations: Secondary | ICD-10-CM

## 2022-05-27 ENCOUNTER — Encounter: Payer: Self-pay | Admitting: *Deleted

## 2022-06-13 DIAGNOSIS — R002 Palpitations: Secondary | ICD-10-CM | POA: Diagnosis not present

## 2022-07-31 ENCOUNTER — Encounter: Payer: Self-pay | Admitting: Cardiology

## 2022-07-31 ENCOUNTER — Ambulatory Visit: Payer: 59 | Attending: Cardiology | Admitting: Cardiology

## 2022-07-31 VITALS — BP 132/78 | HR 68 | Ht 66.0 in | Wt 219.0 lb

## 2022-07-31 DIAGNOSIS — I480 Paroxysmal atrial fibrillation: Secondary | ICD-10-CM

## 2022-07-31 DIAGNOSIS — E669 Obesity, unspecified: Secondary | ICD-10-CM | POA: Diagnosis not present

## 2022-07-31 DIAGNOSIS — R5383 Other fatigue: Secondary | ICD-10-CM | POA: Diagnosis not present

## 2022-07-31 MED ORDER — METOPROLOL SUCCINATE ER 25 MG PO TB24: 12.5000 mg | ORAL_TABLET | Freq: Every day | ORAL | 3 refills | Status: DC
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
## 2022-07-31 NOTE — Patient Instructions (Addendum)
Medication Instructions:  Your physician has recommended you make the following change in your medication:  START: Toprol XL 12.'5mg'$  at night  *If you need a refill on your cardiac medications before your next appointment, please call your pharmacy*   Lab Work: NONE If you have labs (blood work) drawn today and your tests are completely normal, you will receive your results only by: [facility] (if you have MyChart) OR A paper copy in the mail If you have any lab test that is abnormal or we need to change your treatment, we will call you to review the results.   Testing/Procedures: Your physician has requested that you have an echocardiogram. Echocardiography is a painless test that uses sound waves to create images of your heart. It provides your doctor with information about the size and shape of your heart and how well your heart's chambers and valves are working. This procedure takes approximately one hour. There are no restrictions for this procedure. Please do NOT wear cologne, perfume, aftershave, or lotions (deodorant is allowed). Please arrive 15 minutes prior to your appointment time.   Your physician has recommended that you have a sleep study. This test records several body functions during sleep, including: brain activity, eye movement, oxygen and carbon dioxide blood levels, heart rate and rhythm, breathing rate and rhythm, the flow of air through your mouth and nose, snoring, body muscle movements, and chest and belly movement.    Follow-Up: At [facility], you and your health needs are our priority.  As part of our continuing mission to provide you with exceptional heart care, we have created designated Provider Care Teams.  These Care Teams include your primary Cardiologist (physician) and Advanced Practice Providers (APPs -  Physician Assistants and Nurse Practitioners) who all work together to provide you with the care you need, when you need it.  We recommend  signing up for the patient portal called "MyChart".  Sign up information is provided on this After Visit Summary.  MyChart is used to connect with patients for Virtual Visits (Telemedicine).  Patients are able to view lab/test results, encounter notes, upcoming appointments, etc.  Non-urgent messages can be sent to your provider as well.   To learn more about what you can do with MyChart, go to [email or web address].    Your next appointment:   4 month(s)  The format for your next appointment:   In Person  Provider:   Berniece Salines, DO

## 2022-08-01 ENCOUNTER — Encounter: Payer: Self-pay | Admitting: Cardiology

## 2022-08-04 ENCOUNTER — Telehealth: Payer: Self-pay | Admitting: *Deleted

## 2022-08-04 NOTE — Telephone Encounter (Signed)
Prior Authorization for split night sleep study sent to [facility] via web portal. [place] approval Number [id number]. Valid dates 08/01/22 to 01/28/23.

## 2022-08-14 ENCOUNTER — Encounter: Payer: Self-pay | Admitting: Nurse Practitioner

## 2022-08-14 ENCOUNTER — Ambulatory Visit: Payer: 59 | Admitting: Nurse Practitioner

## 2022-08-14 ENCOUNTER — Ambulatory Visit (HOSPITAL_COMMUNITY): Payer: 59 | Attending: Cardiology

## 2022-08-14 VITALS — BP 144/80 | Temp 97.4°F | Wt 216.6 lb

## 2022-08-14 DIAGNOSIS — Z8589 Personal history of malignant neoplasm of other organs and systems: Secondary | ICD-10-CM

## 2022-08-14 DIAGNOSIS — H9202 Otalgia, left ear: Secondary | ICD-10-CM

## 2022-08-14 DIAGNOSIS — L989 Disorder of the skin and subcutaneous tissue, unspecified: Secondary | ICD-10-CM

## 2022-08-14 DIAGNOSIS — I480 Paroxysmal atrial fibrillation: Secondary | ICD-10-CM | POA: Insufficient documentation

## 2022-08-14 DIAGNOSIS — Z23 Encounter for immunization: Secondary | ICD-10-CM

## 2022-08-14 LAB — ECHOCARDIOGRAM COMPLETE
Area-P 1/2: 4.29 cm2
S' Lateral: 2.2 cm
Weight: 3465.6 oz

## 2022-08-14 NOTE — Patient Instructions (Signed)
It was great to see you!  I have placed a referral to dermatology, they will call to schedule.   Let's follow-up in 6 months, sooner if you have concerns.  If a referral was placed today, you will be contacted for an appointment. Please note that routine referrals can sometimes take up to 3-4 weeks to process. Please call our office if you haven't heard anything after this time frame.  Take care,  Vance Peper, NP

## 2022-08-14 NOTE — Assessment & Plan Note (Signed)
She has skin lesion to left calf. With history, will place referral to dermatology.

## 2022-08-14 NOTE — Progress Notes (Signed)
Established Patient Office Visit  Subjective   Patient ID: Amy Ritter, female    DOB: 09/23/1961  Age: 61 y.o. MRN: [id number]  Chief Complaint  Patient presents with   Follow-up    3 mo f/u palpitations. Pt requesting derm referral. Pt c/o recurrent pain/pressure in LT ear for several weeks.     HPI  Amy Ritter is here to follow-up on palpitations. She went to cardiology and was diagnosed with paroxysmal a-fib.  She was started on Toprol-XL 12.5 mg daily and states that her palpitations have significantly decreased.  She still gets them every once in a while, however they are not as painful as they were before.  She is not having any side effects to this medication.  She has also noticed a spot on her skin on her left calf. She states that it itches and is red. She has a history of basal and squamous cell cancer and would like a referral to dermatology.   She had a cold a few weeks ago. Since then, her left ear has been hurting and having pressure. Then a few nights ago, she felt a popping sensation and then the ear started draining. It has been feeling better since then. All her other symptoms are resolving.   ROS See pertinent positives and negatives per HPI.    Objective:     BP (!) 144/80 (BP Location: Right Arm, Patient Position: Sitting, Cuff Size: Large)   Temp (!) 97.4 F (36.3 C) (Temporal)   Wt 216 lb 9.6 oz (98.2 kg)   LMP 10/13/2004   BMI 34.96 kg/m     Physical Exam Vitals and nursing note reviewed.  Constitutional:      General: She is not in acute distress.    Appearance: Normal appearance.  HENT:     Head: Normocephalic.     Right Ear: Tympanic membrane, ear canal and external ear normal.     Left Ear: Tympanic membrane, ear canal and external ear normal.     Nose:     Right Sinus: No maxillary sinus tenderness or frontal sinus tenderness.     Left Sinus: No maxillary sinus tenderness or frontal sinus tenderness.  Eyes:      Conjunctiva/sclera: Conjunctivae normal.  Cardiovascular:     Rate and Rhythm: Normal rate and regular rhythm.     Pulses: Normal pulses.     Heart sounds: Normal heart sounds.  Pulmonary:     Effort: Pulmonary effort is normal.     Breath sounds: Normal breath sounds.  Musculoskeletal:     Cervical back: Normal range of motion and neck supple. No tenderness.  Lymphadenopathy:     Cervical: No cervical adenopathy.  Skin:    General: Skin is warm.     Findings: Lesion (red, circular, to left calf) present.  Neurological:     General: No focal deficit present.     Mental Status: She is alert and oriented to person, place, and time.  Psychiatric:        Mood and Affect: Mood normal.        Behavior: Behavior normal.        Thought Content: Thought content normal.        Judgment: Judgment normal.      Assessment & Plan:   Problem List Items Addressed This Visit       Cardiovascular and Mediastinum   Paroxysmal A-fib ([place]) - Primary    Chronic, stable.  She is currently following  with cardiology and was started on Toprol-XL 12.5 mg daily.  We will have her continue this as it is helping her symptoms and she is not having any side effects.  CHA2DS2-VASc score is a 1.  Continue collaboration recommendations from cardiology.        Other   History of squamous cell carcinoma    She has skin lesion to left calf. With history, will place referral to dermatology.       Relevant Orders   Ambulatory referral to Dermatology   Other Visit Diagnoses     Left ear pain       TM normal on exam. She can take tylenol or ibuporfen as needed for pain. F/U if symptoms worsen or don't improve.   Skin lesion       Red lesion to left posterior calf. With history of skin cancer, will place referral to dermatology.   Relevant Orders   Ambulatory referral to Dermatology   Need for influenza vaccination       Flu vaccine given today   Relevant Orders   Flu Vaccine QUAD 6+ mos PF IM (Fluarix  Quad PF) (Completed)       Return in about 6 months (around 02/12/2023) for CPE.    Charyl Dancer, NP

## 2022-08-14 NOTE — Assessment & Plan Note (Signed)
Chronic, stable.  She is currently following with cardiology and was started on Toprol-XL 12.5 mg daily.  We will have her continue this as it is helping her symptoms and she is not having any side effects.  CHA2DS2-VASc score is a 1.  Continue collaboration recommendations from cardiology.

## 2022-08-29 ENCOUNTER — Encounter: Payer: Self-pay | Admitting: Nurse Practitioner

## 2022-09-08 ENCOUNTER — Encounter (HOSPITAL_BASED_OUTPATIENT_CLINIC_OR_DEPARTMENT_OTHER): Payer: 59 | Admitting: Cardiovascular Disease

## 2022-09-18 DIAGNOSIS — R208 Other disturbances of skin sensation: Secondary | ICD-10-CM | POA: Diagnosis not present

## 2022-09-18 DIAGNOSIS — D1801 Hemangioma of skin and subcutaneous tissue: Secondary | ICD-10-CM | POA: Diagnosis not present

## 2022-09-18 DIAGNOSIS — L298 Other pruritus: Secondary | ICD-10-CM | POA: Diagnosis not present

## 2022-09-18 DIAGNOSIS — Z789 Other specified health status: Secondary | ICD-10-CM | POA: Diagnosis not present

## 2022-09-18 DIAGNOSIS — L814 Other melanin hyperpigmentation: Secondary | ICD-10-CM | POA: Diagnosis not present

## 2022-09-18 DIAGNOSIS — L538 Other specified erythematous conditions: Secondary | ICD-10-CM | POA: Diagnosis not present

## 2022-09-18 DIAGNOSIS — L821 Other seborrheic keratosis: Secondary | ICD-10-CM | POA: Diagnosis not present

## 2022-09-18 DIAGNOSIS — L820 Inflamed seborrheic keratosis: Secondary | ICD-10-CM | POA: Diagnosis not present

## 2022-09-21 ENCOUNTER — Encounter: Payer: Self-pay | Admitting: Cardiology

## 2022-09-23 MED ORDER — METOPROLOL SUCCINATE ER 25 MG PO TB24: 25.0000 mg | ORAL_TABLET | Freq: Every day | ORAL | 3 refills | Status: DC

## 2022-09-28 ENCOUNTER — Ambulatory Visit (HOSPITAL_BASED_OUTPATIENT_CLINIC_OR_DEPARTMENT_OTHER): Payer: 59 | Admitting: Cardiovascular Disease

## 2022-11-27 ENCOUNTER — Encounter: Payer: Self-pay | Admitting: Cardiology

## 2022-11-27 ENCOUNTER — Ambulatory Visit: Payer: Self-pay | Attending: Cardiology | Admitting: Cardiology

## 2022-11-27 VITALS — BP 130/88 | HR 53 | Ht 66.0 in | Wt 221.0 lb

## 2022-11-27 DIAGNOSIS — I480 Paroxysmal atrial fibrillation: Secondary | ICD-10-CM

## 2022-11-27 DIAGNOSIS — E669 Obesity, unspecified: Secondary | ICD-10-CM

## 2022-11-27 MED ORDER — METOPROLOL SUCCINATE ER 25 MG PO TB24: 25.0000 mg | ORAL_TABLET | Freq: Every day | ORAL | 3 refills | Status: AC
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
## 2022-11-27 NOTE — Patient Instructions (Signed)
Medication Instructions:  Your physician recommends that you continue on your current medications as directed. Please refer to the Current Medication list given to you today.  *If you need a refill on your cardiac medications before your next appointment, please call your pharmacy*   Lab Work: None If you have labs (blood work) drawn today and your tests are completely normal, you will receive your results only by: [place] (if you have MyChart) OR A paper copy in the mail If you have any lab test that is abnormal or we need to change your treatment, we will call you to review the results.   Testing/Procedures: None   Follow-Up: At [facility], you and your health needs are our priority.  As part of our continuing mission to provide you with exceptional heart care, we have created designated Provider Care Teams.  These Care Teams include your primary Cardiologist (physician) and Advanced Practice Providers (APPs -  Physician Assistants and Nurse Practitioners) who all work together to provide you with the care you need, when you need it.  Your next appointment:   1 year(s)  Provider:   Berniece Salines, DO

## 2022-11-28 ENCOUNTER — Telehealth: Payer: Self-pay | Admitting: Licensed Clinical Social Worker

## 2022-11-28 NOTE — Telephone Encounter (Signed)
H&V Care Navigation CSW Progress Note  Clinical Social Worker contacted patient by phone to f/u on referral as pt without any current coverage, is self employed. No answer today at [phone], left voicemail requesting a call back. If no return call today then will re-attempt next week.   Patient is participating in a Managed Medicaid Plan:  No, self pay only.   SDOH Screenings   Depression (PHQ2-9): Low Risk  (11/07/2021)  Tobacco Use: Low Risk  (11/27/2022)    Westley Hummer, MSW, [place]  [phone]- work cell phone (preferred) [phone]- desk phone

## 2022-12-04 ENCOUNTER — Telehealth: Payer: Self-pay | Admitting: Licensed Clinical Social Worker

## 2022-12-04 NOTE — Progress Notes (Signed)
Heart and Vascular Care Navigation  12/04/2022  Amy Ritter August 03, 1961 [id number]  Reason for Referral: insurance challenges Patient is participating in a Managed Medicaid Plan:No, BCBS commercial plan only   Engaged with patient by telephone for initial visit for Heart and Vascular Care Coordination.                                                                                                   Assessment:      LCSW has received call back from pt. Introduced self, role, reason for call. Pt confirmed home address, updated her PCP with me and confirmed spouse contact. 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 She shares that she does have insurance coverage under BCBS but has been updated that it is an [facility] provider plan so [facility] providers are [facility]. This means that her estimate to see Dr. Harriet Masson has been ver high. LCSW shared that if she enrolled through [employer] she could speak with one of their navigators regarding her current plan and if any changes can be made if she wishes to continue care with [place]. If for some reason she cannot change then she should contact her plan to see if any concessions to maintain provider available. If not then pt should contact plan to see who is in network if it is financially not feasible to continue to see our team at [facility]. Pt denies any additional cost of living concerns at this time. Obtaining and affording her medications with no issues.                                 HRT/VAS Care Coordination     Patients Home Cardiology Office [place] Team Social Worker   Social Worker Name: Valeda Malm, [id number]   Living arrangements for the past 2 months Single Family Home   Lives with: Spouse   Patient Current Insurance Sports coach   Patient Has Concern With [place] Yes   Patient Concerns With Medical Bills OON costs b/c insurance is OON w/ [place] Referrals: [place]   Does Patient Have Prescription Coverage? Yes       Social History:                                                                             SDOH Screenings   Food Insecurity: No Food Insecurity (12/04/2022)  Housing: Low Risk  (12/04/2022)  Transportation Needs: No Transportation Needs (12/04/2022)  Utilities: Not At Risk (12/04/2022)  Depression (PHQ2-9): Low Risk  (11/07/2021)  Financial Resource Strain: Low Risk  (12/04/2022)  Tobacco Use: Low Risk  (11/27/2022)  SDOH Interventions: Financial Resources:  Financial Strain Interventions: Other (Comment) (some expensive OOP costs with insurance- OON with [place])  Food Insecurity:  Food Insecurity Interventions: Intervention Not Indicated  Housing Insecurity:  Housing Interventions: Intervention Not Indicated  Transportation:   Transportation Interventions: Intervention Not Indicated    Other Care Navigation Interventions:     Provided Pharmacy assistance resources  No issues obtaining or affording medications with current coverage   Follow-up plan:   LCSW has mailed pt my card, [place] help line and assistance options as well as a note with the above information discussed. I remain available as needed should additional concerns arise I may be able to assist her with additional resources.

## 2022-12-04 NOTE — Telephone Encounter (Signed)
H&V Care Navigation CSW Progress Note  Clinical Social Worker  contacted patient by phone to f/u on referral as pt without any current coverage, is self employed. No answer again today at [phone], left voicemail requesting a call back. If no return call today then will re-attempt next week given current connectivity issues nationwide.    Patient is participating in a Managed Medicaid Plan:  No, self pay only. Note today there is generic commercial/BCBS popping up as possible coverage   SDOH Screenings   Depression (PHQ2-9): Low Risk  (11/07/2021)  Tobacco Use: Low Risk  (11/27/2022)   Amy Ritter, MSW, [place]  [phone]- work cell phone (preferred) [phone]- desk phone
